# Patient Record
Sex: Male | Born: 1950 | Race: White | Hispanic: No | Smoking: Current every day smoker
Health system: Southern US, Community
[De-identification: ages and names within clinical notes are randomized; demographics above are authoritative.]

## PROBLEM LIST (undated history)

## (undated) DIAGNOSIS — T7840XA Allergy, unspecified, initial encounter: Secondary | ICD-10-CM

## (undated) DIAGNOSIS — E785 Hyperlipidemia, unspecified: Secondary | ICD-10-CM

## (undated) DIAGNOSIS — I493 Ventricular premature depolarization: Secondary | ICD-10-CM

## (undated) DIAGNOSIS — I10 Essential (primary) hypertension: Secondary | ICD-10-CM

## (undated) DIAGNOSIS — B1920 Unspecified viral hepatitis C without hepatic coma: Secondary | ICD-10-CM

## (undated) DIAGNOSIS — K7460 Unspecified cirrhosis of liver: Secondary | ICD-10-CM

## (undated) DIAGNOSIS — I4729 Other ventricular tachycardia: Secondary | ICD-10-CM

## (undated) HISTORY — DX: Unspecified cirrhosis of liver: K74.60

## (undated) HISTORY — DX: Hyperlipidemia, unspecified: E78.5

## (undated) HISTORY — DX: Other ventricular tachycardia: I47.29

## (undated) HISTORY — DX: Allergy, unspecified, initial encounter: T78.40XA

## (undated) HISTORY — PX: POLYPECTOMY: SHX149

## (undated) HISTORY — DX: Ventricular premature depolarization: I49.3

## (undated) HISTORY — PX: GANGLION CYST EXCISION: SHX1691

## (undated) HISTORY — DX: Essential (primary) hypertension: I10

## (undated) HISTORY — PX: COLONOSCOPY: SHX174

## (undated) HISTORY — DX: Unspecified viral hepatitis C without hepatic coma: B19.20

---

## 2013-10-23 ENCOUNTER — Other Ambulatory Visit: Payer: Self-pay | Admitting: Nurse Practitioner

## 2013-10-23 DIAGNOSIS — C220 Liver cell carcinoma: Secondary | ICD-10-CM

## 2013-11-02 ENCOUNTER — Ambulatory Visit
Admission: RE | Admit: 2013-11-02 | Discharge: 2013-11-02 | Disposition: A | Discharge status: Home / Self Care | Payer: PRIVATE HEALTH INSURANCE | Source: Ambulatory Visit | Attending: Nurse Practitioner | Admitting: Nurse Practitioner

## 2013-11-02 DIAGNOSIS — C220 Liver cell carcinoma: Secondary | ICD-10-CM

## 2013-12-02 ENCOUNTER — Encounter: Payer: Self-pay | Admitting: Internal Medicine

## 2014-01-19 ENCOUNTER — Encounter: Payer: Self-pay | Admitting: Internal Medicine

## 2014-01-19 ENCOUNTER — Ambulatory Visit (INDEPENDENT_AMBULATORY_CARE_PROVIDER_SITE_OTHER): Payer: PRIVATE HEALTH INSURANCE | Admitting: Internal Medicine

## 2014-01-19 VITALS — BP 114/70 | HR 80 | Ht 66.5 in | Wt 234.2 lb

## 2014-01-19 DIAGNOSIS — K7460 Unspecified cirrhosis of liver: Secondary | ICD-10-CM

## 2014-01-19 DIAGNOSIS — Z1211 Encounter for screening for malignant neoplasm of colon: Secondary | ICD-10-CM

## 2014-01-19 MED ORDER — MOVIPREP 100 G PO SOLR: 1.0000 | Freq: Once | ORAL | Status: DC

## 2014-01-19 NOTE — Progress Notes (Signed)
HISTORY OF PRESENT ILLNESS:  Hector Matthews is a 63 y.o. male with hypertension and hepatitis C (genotype 3), who is referred today by the [facility]atology (Dr. Eddie Dibbles Schmeltzer - [facility]) for screening upper endoscopy to rule out esophageal varices and routine screening colonoscopy. The patient was evaluated by the nurse practitioner of the hepatology group 11/30/2013 (reviewed). Laboratories from the months previous revealed ANA negative, HIV negative, ferritin 540, AFP 11.2, AST 40, ALT 57, alkaline phosphatase 53, total bilirubin 0.6, albumin 3.5. Abdominal ultrasound revealed increased echogenicity consistent with steatosis. No other abnormalities. FibroSure F4 (consistent with early cirrhosis). The patient began ribavirin 400 mg twice a day and Sofosbuvir 400 mg daily on 01/10/2014. Tolerating it well. 24 weeks of therapy anticipated. The patient works as a Administrator. GI review of systems is entirely negative. No prior GI evaluations. No family history of colon cancer or liver cancer  REVIEW OF SYSTEMS:  All non-GI ROS negative entirely upon review  Past Medical History  Diagnosis Date  . Hepatitis C   . Hypertension   . Cirrhosis     Past Surgical History  Procedure Laterality Date  . Ganglion cyst excision Right     foot    Social History Hector Matthews  reports that he has been smoking Cigarettes.  He has been smoking about 1.00 pack per day. He has never used smokeless tobacco. He reports that he does not drink alcohol or use illicit drugs.  family history includes Hypertension in his brother; Pancreatic cancer in his father.  No Known Allergies     PHYSICAL EXAMINATION: Vital signs: BP 114/70  Pulse 80  Ht 5' 6.5" (1.689 m)  Wt 234 lb 4 oz (106.255 kg)  BMI 37.25 kg/m2  Constitutional: generally well-appearing, no acute distress Psychiatric: alert and oriented x3, cooperative Eyes: extraocular movements intact, anicteric, conjunctiva pink Mouth: oral  pharynx moist, no lesions Neck: supple no lymphadenopathy Cardiovascular: heart regular rate and rhythm, no murmur Lungs: clear to auscultation bilaterally Abdomen: soft, nontender, nondistended, no obvious ascites, no peritoneal signs, normal bowel sounds, no organomegaly Rectal: Deferred until colonoscopy Extremities: no lower extremity edema bilaterally Skin: no lesions on visible extremities Neuro: No focal deficits. No asterixis.    ASSESSMENT:  #1. Hepatitis C, genotype 3. Recent initiation of medical therapy as discussed above #2. Colon cancer screening. Baseline risk   PLAN:  #1. Upper endoscopy to screen for varices.The nature of the procedure, as well as the risks, benefits, and alternatives were carefully and thoroughly reviewed with the patient. Ample time for discussion and questions allowed. The patient understood, was satisfied, and agreed to proceed. #2. Screening colonoscopy. Movi prep prescribed. Patient instructed on its use. The nature of the procedure, as well as the risks, benefits, and alternatives were carefully and thoroughly reviewed with the patient. Ample time for discussion and questions allowed. The patient understood, was satisfied, and agreed to proceed. #3. Ongoing management of liver disease per hepatology specialties clinic

## 2014-01-19 NOTE — Patient Instructions (Addendum)
You have been given a separate informational sheet regarding your tobacco use, the importance of quitting and local resources to help you quit.   You have been scheduled for an endoscopy and colonoscopy with propofol. Please follow the written instructions given to you at your visit today. Please pick up your prep at the pharmacy within the next 1-3 days. If you use inhalers (even only as needed), please bring them with you on the day of your procedure. Your physician has requested that you go to www.startemmi.com and enter the access code given to you at your visit today. This web site gives a general overview about your procedure. However, you should still follow specific instructions given to you by our office regarding your preparation for the procedure.

## 2014-02-16 ENCOUNTER — Other Ambulatory Visit: Payer: Self-pay | Admitting: Nurse Practitioner

## 2014-02-16 DIAGNOSIS — C220 Liver cell carcinoma: Secondary | ICD-10-CM

## 2014-03-01 ENCOUNTER — Ambulatory Visit
Admission: RE | Admit: 2014-03-01 | Discharge: 2014-03-01 | Disposition: A | Discharge status: Home / Self Care | Payer: PRIVATE HEALTH INSURANCE | Source: Ambulatory Visit | Attending: Nurse Practitioner | Admitting: Nurse Practitioner

## 2014-03-01 DIAGNOSIS — C220 Liver cell carcinoma: Secondary | ICD-10-CM

## 2014-03-08 ENCOUNTER — Encounter: Payer: Self-pay | Admitting: Internal Medicine

## 2014-03-08 ENCOUNTER — Ambulatory Visit (AMBULATORY_SURGERY_CENTER): Payer: PRIVATE HEALTH INSURANCE | Admitting: Internal Medicine

## 2014-03-08 ENCOUNTER — Other Ambulatory Visit: Payer: Self-pay | Admitting: Nurse Practitioner

## 2014-03-08 VITALS — BP 115/70 | HR 56 | Temp 97.9°F | Resp 19 | Ht 66.5 in | Wt 234.0 lb

## 2014-03-08 DIAGNOSIS — K5730 Diverticulosis of large intestine without perforation or abscess without bleeding: Secondary | ICD-10-CM

## 2014-03-08 DIAGNOSIS — K2100 Gastro-esophageal reflux disease with esophagitis, without bleeding: Secondary | ICD-10-CM

## 2014-03-08 DIAGNOSIS — Z1211 Encounter for screening for malignant neoplasm of colon: Secondary | ICD-10-CM

## 2014-03-08 DIAGNOSIS — K7460 Unspecified cirrhosis of liver: Secondary | ICD-10-CM

## 2014-03-08 DIAGNOSIS — D126 Benign neoplasm of colon, unspecified: Secondary | ICD-10-CM

## 2014-03-08 DIAGNOSIS — K769 Liver disease, unspecified: Secondary | ICD-10-CM

## 2014-03-08 DIAGNOSIS — K2900 Acute gastritis without bleeding: Secondary | ICD-10-CM

## 2014-03-08 MED ORDER — SODIUM CHLORIDE 0.9 % IV SOLN: 500.0000 mL | INTRAVENOUS | Status: DC

## 2014-03-08 NOTE — Progress Notes (Signed)
Called to room to assist during endoscopic procedure.  Patient ID and intended procedure confirmed with present staff. Received instructions for my participation in the procedure from the performing physician.  

## 2014-03-08 NOTE — Op Note (Signed)
[place]  [address] [place], [id number]   ENDOSCOPY PROCEDURE REPORT  PATIENT: Hector, Matthews  MR#: [id number] BIRTHDATE: [birth year] , 63  yrs. old GENDER: Male ENDOSCOPIST: Eustace Quail, MD REFERRED BY:  .  Self / Office PROCEDURE DATE:  03/08/2014 PROCEDURE:  EGD w/ biopsy ASA CLASS:     Class II INDICATIONS:  Screening for varices. MEDICATIONS: MAC sedation, administered by CRNA and propofol (Diprivan) 70mg  IV TOPICAL ANESTHETIC: none  DESCRIPTION OF PROCEDURE: After the risks benefits and alternatives of the procedure were thoroughly explained, informed consent was obtained.  The LB [id number] endoscope was introduced through the mouth and advanced to the second portion of the duodenum. Without limitations.  The instrument was slowly withdrawn as the mucosa was fully examined.    EXAM: The esophagus revealed mild distal esophagitis with edema and friability of the mucosa.  No varices.  Stomach revealed several small antral erosions, but was otherwise normal.  No gastric varices.  CLO biopsy taken.  The duodenum was normal.  Retroflexed views revealed no abnormalities.     The scope was then withdrawn from the patient and the procedure completed.  COMPLICATIONS: There were no complications. ENDOSCOPIC IMPRESSION: 1. GERD with endoscopic evidence of esophagitis 2. Mild gastritis status post CLO biopsy 3. No esophageal varices or portal hypertensive gastropathy  RECOMMENDATIONS: 1.  Prilosec OTC 20 mg daily 2.  Rx CLO if positive 3.  Repeat upper Endoscopy to screen for varices in 2 years 4. Return to the care of your primary provider and hepatologist  REPEAT EXAM:  eSigned:  Eustace Quail, MD 03/08/2014 2:45 PM   CC:The Patient  ; Suella Grove, PA-C; Theodoro Parma, MD [facility])

## 2014-03-08 NOTE — Op Note (Signed)
[place]  [address] [place], [id number]   COLONOSCOPY PROCEDURE REPORT  PATIENT: Hector Matthews, Hector Matthews  MR#: [id number] BIRTHDATE: 15-Feb-1951 , 63  yrs. old GENDER: Male ENDOSCOPIST: Eustace Quail, MD REFERRED BY:.  Self / Office (Dr Eddie Dibbles Schmeltzer - [facility]) PROCEDURE DATE:  03/08/2014 PROCEDURE:   Colonoscopy with snare polypectomy x 8 First Screening Colonoscopy - Avg.  risk and is 50 yrs.  old or older Yes.  Prior Negative Screening - Now for repeat screening. N/A  History of Adenoma - Now for follow-up colonoscopy & has been > or = to 3 yrs.  N/A  Polyps Removed Today? Yes. ASA CLASS:   Class II INDICATIONS:average risk screening. MEDICATIONS: MAC sedation, administered by CRNA and propofol (Diprivan) 320mg  IV DESCRIPTION OF PROCEDURE:   After the risks benefits and alternatives of the procedure were thoroughly explained, informed consent was obtained.  A digital rectal exam revealed no abnormalities of the rectum.   The LB [id number]  endoscope was introduced through the anus and advanced to the cecum, which was identified by both the appendix and ileocecal valve. No adverse events experienced.   The quality of the prep was excellent, using MoviPrep  The instrument was then slowly withdrawn as the colon was fully examined.  COLON FINDINGS: Eight polyps ranging between 3-70mm in size were found at the cecum, ascending, and transverse colon.  A polypectomy was performed with a cold snare.  The resection was complete and the polyp tissue was completely retrieved.   Severe diverticulosis was noted in the left colon.   The colon mucosa was otherwise normal.  Retroflexed views revealed internal hemorrhoids. The time to cecum=2 min 36 sec.  Withdrawal time=14 minutes 15 seconds.  The scope was withdrawn and the procedure completed. COMPLICATIONS: There were no complications. ENDOSCOPIC IMPRESSION: 1.   Eight polyps were found in the  colon; polypectomy  was performed with a cold snare 2.   Severe diverticulosis was noted in the left colon 3.   The colon mucosa was otherwise normal  RECOMMENDATIONS: 1. Repeat Colonoscopy in 3 years.eSigned:  Eustace Quail, MD 03/08/2014 2:40 PM   cc: The Patient   ; Suella Grove, PA-C; Theodoro Parma, MD [facility])   PATIENT NAME:  [name], [name] MR#: [id number]

## 2014-03-08 NOTE — Patient Instructions (Addendum)
YOU HAD AN ENDOSCOPIC PROCEDURE TODAY AT THE [facility]: Refer to the procedure report that was given to you for any specific questions about what was found during the examination.  If the procedure report does not answer your questions, please call your gastroenterologist to clarify.  If you requested that your care partner not be given the details of your procedure findings, then the procedure report has been included in a sealed envelope for you to review at your convenience later.  YOU SHOULD EXPECT: Some feelings of bloating in the abdomen. Passage of more gas than usual.  Walking can help get rid of the air that was put into your GI tract during the procedure and reduce the bloating. If you had a lower endoscopy (such as a colonoscopy or flexible sigmoidoscopy) you may notice spotting of blood in your stool or on the toilet paper. If you underwent a bowel prep for your procedure, then you may not have a normal bowel movement for a few days.  DIET: Your first meal following the procedure should be a light meal and then it is ok to progress to your normal diet.  A half-sandwich or bowl of soup is an example of a good first meal.  Heavy or fried foods are harder to digest and may make you feel nauseous or bloated.  Likewise meals heavy in dairy and vegetables can cause extra gas to form and this can also increase the bloating.  Drink plenty of fluids but you should avoid alcoholic beverages for 24 hours.  ACTIVITY: Your care partner should take you home directly after the procedure.  You should plan to take it easy, moving slowly for the rest of the day.  You can resume normal activity the day after the procedure however you should NOT DRIVE or use heavy machinery for 24 hours (because of the sedation medicines used during the test).    SYMPTOMS TO REPORT IMMEDIATELY: A gastroenterologist can be reached at any hour.  During normal business hours, 8:30 AM to 5:00 PM Monday through Friday,  call [phone].  After hours and on weekends, please call the GI answering service at [phone] who will take a message and have the physician on call contact you.   Following lower endoscopy (colonoscopy or flexible sigmoidoscopy):  Excessive amounts of blood in the stool  Significant tenderness or worsening of abdominal pains  Swelling of the abdomen that is new, acute  Fever of 100F or higher  Following upper endoscopy (EGD)  Vomiting of blood or coffee ground material  New chest pain or pain under the shoulder blades  Painful or persistently difficult swallowing  New shortness of breath  Fever of 100F or higher  Black, tarry-looking stools  FOLLOW UP: If any biopsies were taken you will be contacted by phone or by letter within the next 1-3 weeks.  Call your gastroenterologist if you have not heard about the biopsies in 3 weeks.  Our staff will call the home number listed on your records the next business day following your procedure to check on you and address any questions or concerns that you may have at that time regarding the information given to you following your procedure. This is a courtesy call and so if there is no answer at the home number and we have not heard from you through the emergency physician on call, we will assume that you have returned to your regular daily activities without incident.  SIGNATURES/CONFIDENTIALITY: You and/or your care   partner have signed paperwork which will be entered into your electronic medical record.  These signatures attest to the fact that that the information above on your After Visit Summary has been reviewed and is understood.  Full responsibility of the confidentiality of this discharge information lies with you and/or your care-partner.  Polyps, diverticulosis information given.  Repeat colonoscopy in 3 years.  GERD information given. Next colonoscopy to screen for varices in 2 years. Gastritis information  given  Prilosec OTC 20 mg. Daily.

## 2014-03-08 NOTE — Progress Notes (Signed)
A/ox3, pleased with MAC, report to RN 

## 2014-03-09 ENCOUNTER — Other Ambulatory Visit: Payer: Self-pay | Admitting: Nurse Practitioner

## 2014-03-09 ENCOUNTER — Telehealth: Payer: Self-pay | Admitting: *Deleted

## 2014-03-09 DIAGNOSIS — K769 Liver disease, unspecified: Secondary | ICD-10-CM

## 2014-03-09 NOTE — Telephone Encounter (Signed)
  Follow up Call-  Call back number 03/08/2014  Post procedure Call Back phone  # [phone]  Permission to leave phone message Yes     Patient questions:  Do you have a fever, pain , or abdominal swelling? No. Pain Score  0 *  Have you tolerated food without any problems? Yes.    Have you been able to return to your normal activities? Yes.    Do you have any questions about your discharge instructions: Diet   No. Medications  No. Follow up visit  No.  Do you have questions or concerns about your Care? No.  Actions: * If pain score is 4 or above: No action needed, pain <4.

## 2014-03-11 LAB — HELICOBACTER PYLORI SCREEN-BIOPSY: UREASE: NEGATIVE

## 2014-03-22 ENCOUNTER — Ambulatory Visit
Admission: RE | Admit: 2014-03-22 | Discharge: 2014-03-22 | Disposition: A | Discharge status: Home / Self Care | Payer: PRIVATE HEALTH INSURANCE | Source: Ambulatory Visit | Attending: Nurse Practitioner | Admitting: Nurse Practitioner

## 2014-03-22 ENCOUNTER — Other Ambulatory Visit: Payer: PRIVATE HEALTH INSURANCE

## 2014-03-22 DIAGNOSIS — K769 Liver disease, unspecified: Secondary | ICD-10-CM

## 2014-03-23 ENCOUNTER — Encounter: Payer: Self-pay | Admitting: Internal Medicine

## 2014-04-06 ENCOUNTER — Other Ambulatory Visit: Payer: PRIVATE HEALTH INSURANCE

## 2014-12-20 ENCOUNTER — Other Ambulatory Visit: Payer: Self-pay | Admitting: Nurse Practitioner

## 2014-12-20 DIAGNOSIS — K769 Liver disease, unspecified: Secondary | ICD-10-CM

## 2014-12-27 ENCOUNTER — Ambulatory Visit
Admission: RE | Admit: 2014-12-27 | Discharge: 2014-12-27 | Disposition: A | Discharge status: Home / Self Care | Payer: PRIVATE HEALTH INSURANCE | Source: Ambulatory Visit | Attending: Nurse Practitioner | Admitting: Nurse Practitioner

## 2014-12-27 DIAGNOSIS — K769 Liver disease, unspecified: Secondary | ICD-10-CM

## 2014-12-27 MED ORDER — IOHEXOL 350 MG/ML SOLN
100.0000 mL | Freq: Once | INTRAVENOUS | Status: AC | PRN
  Administered 2014-12-27: 100 mL via INTRAVENOUS

## 2015-06-27 ENCOUNTER — Other Ambulatory Visit: Payer: Self-pay | Admitting: Nurse Practitioner

## 2015-06-27 DIAGNOSIS — C220 Liver cell carcinoma: Secondary | ICD-10-CM

## 2015-07-04 ENCOUNTER — Ambulatory Visit
Admission: RE | Admit: 2015-07-04 | Discharge: 2015-07-04 | Disposition: A | Discharge status: Home / Self Care | Payer: PRIVATE HEALTH INSURANCE | Source: Ambulatory Visit | Attending: Nurse Practitioner | Admitting: Nurse Practitioner

## 2015-07-04 DIAGNOSIS — C220 Liver cell carcinoma: Secondary | ICD-10-CM

## 2015-12-26 ENCOUNTER — Other Ambulatory Visit: Payer: Self-pay | Admitting: Nurse Practitioner

## 2015-12-26 DIAGNOSIS — K7469 Other cirrhosis of liver: Secondary | ICD-10-CM

## 2016-01-09 ENCOUNTER — Other Ambulatory Visit: Payer: PRIVATE HEALTH INSURANCE

## 2016-01-23 ENCOUNTER — Ambulatory Visit
Admission: RE | Admit: 2016-01-23 | Discharge: 2016-01-23 | Disposition: A | Discharge status: Home / Self Care | Payer: PRIVATE HEALTH INSURANCE | Source: Ambulatory Visit | Attending: Nurse Practitioner | Admitting: Nurse Practitioner

## 2016-01-23 DIAGNOSIS — K7469 Other cirrhosis of liver: Secondary | ICD-10-CM

## 2016-06-26 ENCOUNTER — Other Ambulatory Visit: Payer: Self-pay | Admitting: Nurse Practitioner

## 2016-06-26 DIAGNOSIS — K7400 Hepatic fibrosis, unspecified: Secondary | ICD-10-CM

## 2016-07-02 ENCOUNTER — Ambulatory Visit
Admission: RE | Admit: 2016-07-02 | Discharge: 2016-07-02 | Disposition: A | Discharge status: Home / Self Care | Payer: PRIVATE HEALTH INSURANCE | Source: Ambulatory Visit | Attending: Nurse Practitioner | Admitting: Nurse Practitioner

## 2016-07-02 DIAGNOSIS — K7400 Hepatic fibrosis, unspecified: Secondary | ICD-10-CM

## 2016-12-11 ENCOUNTER — Other Ambulatory Visit: Payer: Self-pay | Admitting: Nurse Practitioner

## 2016-12-11 DIAGNOSIS — K7469 Other cirrhosis of liver: Secondary | ICD-10-CM

## 2016-12-24 ENCOUNTER — Ambulatory Visit
Admission: RE | Admit: 2016-12-24 | Discharge: 2016-12-24 | Disposition: A | Discharge status: Home / Self Care | Payer: PRIVATE HEALTH INSURANCE | Source: Ambulatory Visit | Attending: Nurse Practitioner | Admitting: Nurse Practitioner

## 2016-12-24 DIAGNOSIS — K7469 Other cirrhosis of liver: Secondary | ICD-10-CM

## 2017-01-09 ENCOUNTER — Encounter: Payer: Self-pay | Admitting: Internal Medicine

## 2017-06-25 ENCOUNTER — Other Ambulatory Visit: Payer: Self-pay | Admitting: Nurse Practitioner

## 2017-06-25 DIAGNOSIS — K824 Cholesterolosis of gallbladder: Secondary | ICD-10-CM

## 2017-06-25 DIAGNOSIS — K7469 Other cirrhosis of liver: Secondary | ICD-10-CM

## 2017-06-25 DIAGNOSIS — K869 Disease of pancreas, unspecified: Secondary | ICD-10-CM

## 2017-07-03 ENCOUNTER — Ambulatory Visit
Admission: RE | Admit: 2017-07-03 | Discharge: 2017-07-03 | Disposition: A | Discharge status: Home / Self Care | Payer: PRIVATE HEALTH INSURANCE | Source: Ambulatory Visit | Attending: Nurse Practitioner | Admitting: Nurse Practitioner

## 2017-07-03 DIAGNOSIS — K7469 Other cirrhosis of liver: Secondary | ICD-10-CM

## 2017-07-03 DIAGNOSIS — K824 Cholesterolosis of gallbladder: Secondary | ICD-10-CM

## 2017-07-03 DIAGNOSIS — K869 Disease of pancreas, unspecified: Secondary | ICD-10-CM

## 2017-07-03 MED ORDER — IOPAMIDOL (ISOVUE-300) INJECTION 61%
125.0000 mL | Freq: Once | INTRAVENOUS | Status: AC | PRN
  Administered 2017-07-03: 125 mL via INTRAVENOUS

## 2017-12-25 ENCOUNTER — Other Ambulatory Visit: Payer: Self-pay | Admitting: Nurse Practitioner

## 2017-12-25 DIAGNOSIS — K7469 Other cirrhosis of liver: Secondary | ICD-10-CM

## 2018-02-17 ENCOUNTER — Ambulatory Visit
Admission: RE | Admit: 2018-02-17 | Discharge: 2018-02-17 | Disposition: A | Discharge status: Home / Self Care | Payer: Medicare Other | Source: Ambulatory Visit | Attending: Nurse Practitioner | Admitting: Nurse Practitioner

## 2018-02-17 DIAGNOSIS — K7460 Unspecified cirrhosis of liver: Secondary | ICD-10-CM | POA: Diagnosis not present

## 2018-02-17 DIAGNOSIS — K7469 Other cirrhosis of liver: Secondary | ICD-10-CM

## 2018-02-19 DIAGNOSIS — I10 Essential (primary) hypertension: Secondary | ICD-10-CM | POA: Diagnosis not present

## 2018-02-19 DIAGNOSIS — Z720 Tobacco use: Secondary | ICD-10-CM | POA: Diagnosis not present

## 2018-02-19 DIAGNOSIS — E782 Mixed hyperlipidemia: Secondary | ICD-10-CM | POA: Diagnosis not present

## 2018-06-24 DIAGNOSIS — K7469 Other cirrhosis of liver: Secondary | ICD-10-CM | POA: Diagnosis not present

## 2018-06-25 ENCOUNTER — Other Ambulatory Visit: Payer: Self-pay | Admitting: Nurse Practitioner

## 2018-06-25 DIAGNOSIS — K7469 Other cirrhosis of liver: Secondary | ICD-10-CM

## 2018-08-27 DIAGNOSIS — Z125 Encounter for screening for malignant neoplasm of prostate: Secondary | ICD-10-CM | POA: Diagnosis not present

## 2018-08-27 DIAGNOSIS — I10 Essential (primary) hypertension: Secondary | ICD-10-CM | POA: Diagnosis not present

## 2018-08-27 DIAGNOSIS — I440 Atrioventricular block, first degree: Secondary | ICD-10-CM | POA: Diagnosis not present

## 2018-08-27 DIAGNOSIS — Z23 Encounter for immunization: Secondary | ICD-10-CM | POA: Diagnosis not present

## 2018-08-27 DIAGNOSIS — E782 Mixed hyperlipidemia: Secondary | ICD-10-CM | POA: Diagnosis not present

## 2018-08-27 DIAGNOSIS — Z0000 Encounter for general adult medical examination without abnormal findings: Secondary | ICD-10-CM | POA: Diagnosis not present

## 2018-08-27 DIAGNOSIS — R9431 Abnormal electrocardiogram [ECG] [EKG]: Secondary | ICD-10-CM | POA: Diagnosis not present

## 2018-08-27 DIAGNOSIS — Z1211 Encounter for screening for malignant neoplasm of colon: Secondary | ICD-10-CM | POA: Diagnosis not present

## 2018-08-28 ENCOUNTER — Ambulatory Visit
Admission: RE | Admit: 2018-08-28 | Discharge: 2018-08-28 | Disposition: A | Discharge status: Home / Self Care | Payer: Medicare Other | Source: Ambulatory Visit | Attending: Nurse Practitioner | Admitting: Nurse Practitioner

## 2018-08-28 DIAGNOSIS — K7469 Other cirrhosis of liver: Secondary | ICD-10-CM

## 2018-08-28 DIAGNOSIS — K7460 Unspecified cirrhosis of liver: Secondary | ICD-10-CM | POA: Diagnosis not present

## 2018-09-24 ENCOUNTER — Encounter: Payer: Self-pay | Admitting: Internal Medicine

## 2018-10-02 ENCOUNTER — Ambulatory Visit: Payer: Self-pay | Admitting: Cardiology

## 2018-10-10 ENCOUNTER — Ambulatory Visit (AMBULATORY_SURGERY_CENTER): Payer: Self-pay | Admitting: *Deleted

## 2018-10-10 VITALS — Ht 67.0 in | Wt 214.0 lb

## 2018-10-10 DIAGNOSIS — Z860100 Personal history of colon polyps, unspecified: Secondary | ICD-10-CM

## 2018-10-10 MED ORDER — SUPREP BOWEL PREP KIT 17.5-3.13-1.6 GM/177ML PO SOLN: 1.0000 | Freq: Once | ORAL | 0 refills | Status: AC

## 2018-10-10 NOTE — Progress Notes (Signed)
No egg or soy allergy known to patient  No issues with past sedation with any surgeries  or procedures, no intubation problems  No diet pills per patient No home 02 use per patient  No blood thinners per patient  Pt denies issues with constipation  No A fib or A flutter  EMMI video  Offered and declined Sample suprep given : lot # [id number], exp 09/2020

## 2018-11-06 ENCOUNTER — Encounter: Payer: Medicare Other | Admitting: Internal Medicine

## 2019-01-28 DIAGNOSIS — K7469 Other cirrhosis of liver: Secondary | ICD-10-CM | POA: Diagnosis not present

## 2019-01-29 DIAGNOSIS — K7469 Other cirrhosis of liver: Secondary | ICD-10-CM | POA: Diagnosis not present

## 2019-02-05 ENCOUNTER — Other Ambulatory Visit: Payer: Self-pay | Admitting: Nurse Practitioner

## 2019-02-05 DIAGNOSIS — K7469 Other cirrhosis of liver: Secondary | ICD-10-CM

## 2019-02-18 ENCOUNTER — Encounter: Payer: Self-pay | Admitting: Internal Medicine

## 2019-03-05 ENCOUNTER — Other Ambulatory Visit: Payer: Self-pay

## 2019-03-05 ENCOUNTER — Ambulatory Visit: Payer: Medicare Other | Admitting: *Deleted

## 2019-03-05 VITALS — Ht 68.0 in | Wt 200.0 lb

## 2019-03-05 DIAGNOSIS — Z86010 Personal history of colonic polyps: Secondary | ICD-10-CM

## 2019-03-05 MED ORDER — SUPREP BOWEL PREP KIT 17.5-3.13-1.6 GM/177ML PO SOLN: 1.0000 | Freq: Once | ORAL | 0 refills | Status: AC

## 2019-03-05 NOTE — Progress Notes (Signed)
No egg or soy allergy known to patient  No issues with past sedation with any surgeries  or procedures, no intubation problems  No diet pills per patient No home 02 use per patient  No blood thinners per patient  Pt denies issues with constipation  No A fib or A flutter  EMMI video sent to pt's e mail   Pt has a Suprep kit at home from 10-2018 PV   Pt verified name, DOB, address and insurance during PV today. Pt mailed instruction packet to included paper to complete and mail back to [facility] with addressed and stamped envelope, Emmi video, copy of consent form to read and not return, and instructions. PV completed over the phone. Pt encouraged to call with questions or issues   Pt is aware that care partner will wait in the car during procedure; if they feel like they will be too hot to wait in the car; they may wait in the lobby.  We want them to wear a mask (we do not have any that we can provide them), practice social distancing, and we will check their temperatures when they get here.  I did remind patient that their care partner needs to stay in the parking lot the entire time. Pt will wear mask into building.

## 2019-03-11 ENCOUNTER — Other Ambulatory Visit: Payer: Medicare Other

## 2019-03-18 ENCOUNTER — Telehealth: Payer: Self-pay | Admitting: Internal Medicine

## 2019-03-18 NOTE — Telephone Encounter (Signed)

## 2019-03-19 ENCOUNTER — Encounter: Payer: Self-pay | Admitting: Internal Medicine

## 2019-03-19 ENCOUNTER — Other Ambulatory Visit: Payer: Self-pay

## 2019-03-19 ENCOUNTER — Ambulatory Visit (AMBULATORY_SURGERY_CENTER): Payer: Medicare Other | Admitting: Internal Medicine

## 2019-03-19 VITALS — BP 114/72 | HR 46 | Temp 97.7°F | Resp 12 | Ht 68.0 in | Wt 200.0 lb

## 2019-03-19 DIAGNOSIS — Z86010 Personal history of colonic polyps: Secondary | ICD-10-CM | POA: Diagnosis not present

## 2019-03-19 DIAGNOSIS — D122 Benign neoplasm of ascending colon: Secondary | ICD-10-CM | POA: Diagnosis not present

## 2019-03-19 MED ORDER — SODIUM CHLORIDE 0.9 % IV SOLN: 500.0000 mL | Freq: Once | INTRAVENOUS | Status: DC

## 2019-03-19 NOTE — Patient Instructions (Signed)
HANDOUTS given for diverticulosis, polyps and hemorrhoids.  YOU HAD AN ENDOSCOPIC PROCEDURE TODAY AT [facility]:   Refer to the procedure report that was given to you for any specific questions about what was found during the examination.  If the procedure report does not answer your questions, please call your gastroenterologist to clarify.  If you requested that your care partner not be given the details of your procedure findings, then the procedure report has been included in a sealed envelope for you to review at your convenience later.  YOU SHOULD EXPECT: Some feelings of bloating in the abdomen. Passage of more gas than usual.  Walking can help get rid of the air that was put into your GI tract during the procedure and reduce the bloating. If you had a lower endoscopy (such as a colonoscopy or flexible sigmoidoscopy) you may notice spotting of blood in your stool or on the toilet paper. If you underwent a bowel prep for your procedure, you may not have a normal bowel movement for a few days.  Please Note:  You might notice some irritation and congestion in your nose or some drainage.  This is from the oxygen used during your procedure.  There is no need for concern and it should clear up in a day or so.  SYMPTOMS TO REPORT IMMEDIATELY:   Following lower endoscopy (colonoscopy or flexible sigmoidoscopy):  Excessive amounts of blood in the stool  Significant tenderness or worsening of abdominal pains  Swelling of the abdomen that is new, acute  Fever of 100F or higher   For urgent or emergent issues, a gastroenterologist can be reached at any hour by calling [phone].   DIET:  We do recommend a small meal at first, but then you may proceed to your regular diet.  Drink plenty of fluids but you should avoid alcoholic beverages for 24 hours.  ACTIVITY:  You should plan to take it easy for the rest of today and you should NOT DRIVE or use heavy machinery until  tomorrow (because of the sedation medicines used during the test).    FOLLOW UP: Our staff will call the number listed on your records 48-72 hours following your procedure to check on you and address any questions or concerns that you may have regarding the information given to you following your procedure. If we do not reach you, we will leave a message.  We will attempt to reach you two times.  During this call, we will ask if you have developed any symptoms of COVID 19. If you develop any symptoms (ie: fever, flu-like symptoms, shortness of breath, cough etc.) before then, please call [phone].  If you test positive for Covid 19 in the 2 weeks post procedure, please call and report this information to [place].    If any biopsies were taken you will be contacted by phone or by letter within the next 1-3 weeks.  Please call us at [phone] if you have not heard about the biopsies in 3 weeks.    SIGNATURES/CONFIDENTIALITY: You and/or your care partner have signed paperwork which will be entered into your electronic medical record.  These signatures attest to the fact that that the information above on your After Visit Summary has been reviewed and is understood.  Full responsibility of the confidentiality of this discharge information lies with you and/or your care-partner.

## 2019-03-19 NOTE — Progress Notes (Signed)
Pt's states no medical or surgical changes since previsit or office visit.  Temp and VS taken by CW

## 2019-03-19 NOTE — Progress Notes (Signed)
PT taken to PACU. Monitors in place. VSS. Report given to RN. 

## 2019-03-19 NOTE — Op Note (Signed)
[place] Patient Name: Hector Matthews Procedure Date: 03/19/2019 2:02 PM MRN: [id number] Endoscopist: Docia Chuck. Henrene Pastor , MD Age: 68 Referring MD:  Date of Birth: [birth year] Gender: Male Account #: [id number] Procedure:                Colonoscopy with cold snare polypectomy x 2 Indications:              High risk colon cancer surveillance: Personal                            history of multiple (3 or more) adenomas. Index                            examination 2015 with 8 adenomas. Overdue for                            follow-up Medicines:                Monitored Anesthesia Care Procedure:                Pre-Anesthesia Assessment:                           - Prior to the procedure, a History and Physical                            was performed, and patient medications and                            allergies were reviewed. The patient's tolerance of                            previous anesthesia was also reviewed. The risks                            and benefits of the procedure and the sedation                            options and risks were discussed with the patient.                            All questions were answered, and informed consent                            was obtained. Prior Anticoagulants: The patient has                            taken no previous anticoagulant or antiplatelet                            agents. ASA Grade Assessment: II - A patient with                            mild systemic disease. After reviewing the risks  and benefits, the patient was deemed in                            satisfactory condition to undergo the procedure.                           After obtaining informed consent, the colonoscope                            was passed under direct vision. Throughout the                            procedure, the patient's blood pressure, pulse, and                            oxygen saturations were monitored  continuously. The                            Colonoscope was introduced through the anus and                            advanced to the the cecum, identified by                            appendiceal orifice and ileocecal valve. The                            ileocecal valve, appendiceal orifice, and rectum                            were photographed. The quality of the bowel                            preparation was excellent. The colonoscopy was                            performed without difficulty. The patient tolerated                            the procedure well. The bowel preparation used was                            SUPREP via split dose instruction. Scope In: 2:20:33 PM Scope Out: 2:34:19 PM Scope Withdrawal Time: 0 hours 11 minutes 11 seconds  Total Procedure Duration: 0 hours 13 minutes 46 seconds  Findings:                 Two polyps were found in the ascending colon. The                            polyps were 3 to 4 mm in size. These polyps were                            removed with a cold snare. Resection  and retrieval                            were complete.                           Multiple diverticula were found in the entire colon.                           Internal hemorrhoids were found during retroflexion.                           The exam was otherwise without abnormality on                            direct and retroflexion views. Complications:            No immediate complications. Estimated blood loss:                            None. Estimated Blood Loss:     Estimated blood loss: none. Impression:               - Two 3 to 4 mm polyps in the ascending colon,                            removed with a cold snare. Resected and retrieved.                           - Diverticulosis in the entire examined colon.                           - Internal hemorrhoids.                           - The examination was otherwise normal on direct                             and retroflexion views. Recommendation:           - Repeat colonoscopy in 5 years for surveillance.                           - Patient has a contact number available for                            emergencies. The signs and symptoms of potential                            delayed complications were discussed with the                            patient. Return to normal activities tomorrow.                            Written discharge instructions were provided to the  patient.                           - Resume previous diet.                           - Continue present medications.                           - Await pathology results.                           NOTE: Patient underwent screening upper endoscopy                            for esophageal varices at the request of his                            hepatologist in 2015. Follow-up examination in 2 to                            3 years recommended. The patient has been                            encouraged to discuss the need for follow-up                            surveillance upper endoscopy with his hepatologist. Docia Chuck. Henrene Pastor, MD 03/19/2019 2:39:36 PM This report has been signed electronically.

## 2019-03-23 ENCOUNTER — Telehealth: Payer: Self-pay

## 2019-03-23 NOTE — Telephone Encounter (Signed)
  Follow up Call-  Call back number 03/19/2019  Post procedure Call Back phone  # [phone]  Permission to leave phone message Yes  Some recent data might be hidden     Patient questions:  Do you have a fever, pain , or abdominal swelling? No. Pain Score  0 *  Have you tolerated food without any problems? Yes.    Have you been able to return to your normal activities? Yes.    Do you have any questions about your discharge instructions: Diet   No. Medications  No. Follow up visit  No.  Do you have questions or concerns about your Care? No.  Actions: * If pain score is 4 or above: No action needed, pain <4. 1. Have you developed a fever since your procedure? no  2.   Have you had an respiratory symptoms (SOB or cough) since your procedure? no  3.   Have you tested positive for COVID 19 since your procedure no  4.   Have you had any family members/close contacts diagnosed with the COVID 19 since your procedure?  no   If yes to any of these questions please route to Joylene John, RN and Alphonsa Gin, Therapist, sports.

## 2019-03-24 ENCOUNTER — Encounter: Payer: Self-pay | Admitting: Internal Medicine

## 2019-07-15 DIAGNOSIS — K7469 Other cirrhosis of liver: Secondary | ICD-10-CM | POA: Diagnosis not present

## 2019-09-03 DIAGNOSIS — Z0000 Encounter for general adult medical examination without abnormal findings: Secondary | ICD-10-CM | POA: Diagnosis not present

## 2019-09-03 DIAGNOSIS — E782 Mixed hyperlipidemia: Secondary | ICD-10-CM | POA: Diagnosis not present

## 2019-09-03 DIAGNOSIS — K7460 Unspecified cirrhosis of liver: Secondary | ICD-10-CM | POA: Diagnosis not present

## 2019-09-03 DIAGNOSIS — Z23 Encounter for immunization: Secondary | ICD-10-CM | POA: Diagnosis not present

## 2019-09-03 DIAGNOSIS — Z125 Encounter for screening for malignant neoplasm of prostate: Secondary | ICD-10-CM | POA: Diagnosis not present

## 2019-09-03 DIAGNOSIS — I10 Essential (primary) hypertension: Secondary | ICD-10-CM | POA: Diagnosis not present

## 2020-01-21 ENCOUNTER — Ambulatory Visit
Admission: RE | Admit: 2020-01-21 | Discharge: 2020-01-21 | Disposition: A | Discharge status: Home / Self Care | Payer: Medicare Other | Source: Ambulatory Visit | Attending: Nurse Practitioner | Admitting: Nurse Practitioner

## 2020-01-21 DIAGNOSIS — K7469 Other cirrhosis of liver: Secondary | ICD-10-CM

## 2020-01-21 DIAGNOSIS — K7460 Unspecified cirrhosis of liver: Secondary | ICD-10-CM | POA: Diagnosis not present

## 2020-01-21 DIAGNOSIS — K8020 Calculus of gallbladder without cholecystitis without obstruction: Secondary | ICD-10-CM | POA: Diagnosis not present

## 2020-03-06 ENCOUNTER — Ambulatory Visit
Admission: EM | Admit: 2020-03-06 | Discharge: 2020-03-06 | Disposition: A | Discharge status: Home / Self Care | Payer: Medicare Other | Attending: Emergency Medicine | Admitting: Emergency Medicine

## 2020-03-06 ENCOUNTER — Other Ambulatory Visit: Payer: Self-pay

## 2020-03-06 ENCOUNTER — Encounter: Payer: Self-pay | Admitting: Emergency Medicine

## 2020-03-06 DIAGNOSIS — M654 Radial styloid tenosynovitis [de Quervain]: Secondary | ICD-10-CM | POA: Diagnosis not present

## 2020-03-06 MED ORDER — NAPROXEN 500 MG PO TABS: 500.0000 mg | ORAL_TABLET | Freq: Two times a day (BID) | ORAL | 0 refills | Status: DC

## 2020-03-06 NOTE — Discharge Instructions (Addendum)
RICE: rest, ice, compression, elevation as needed for pain.   Cold therapy (ice packs) can be used to help swelling both after injury and after prolonged use of areas of chronic pain/aches.  Pain medication:  500 mg Naprosyn/Aleve (naproxen) every 12 hours with food:  AVOID other NSAIDs while taking this (may have Tylenol).

## 2020-03-06 NOTE — ED Triage Notes (Signed)
Pt presents to [facility] for assessment of 1 week of left wrist swelling, very mild pain.  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

## 2020-03-06 NOTE — ED Provider Notes (Signed)
[facility]    CSN: [id number] Arrival date & time: 03/06/20  1339      History   Chief Complaint Chief Complaint  Patient presents with  . Wrist Pain    HPI Hector Matthews is a 69 y.o. male with history of cirrhosis, hep C, hypertension, allergies presenting for 1 week course of left wrist pain.  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  Denies numbness, injury, deformity, discoloration.  Has tried single dose of ibuprofen without relief.   Past Medical History:  Diagnosis Date  . Allergy   . Cirrhosis ([place])   . Hepatitis C   . Hyperlipidemia   . Hypertension     There are no problems to display for this patient.   Past Surgical History:  Procedure Laterality Date  . COLONOSCOPY    . GANGLION CYST EXCISION Right    foot  . POLYPECTOMY         Home Medications    Prior to Admission medications   Medication Sig Start Date End Date Taking? Authorizing Provider  atorvastatin (LIPITOR) 10 MG tablet TK 1 T PO QD 05/28/18   [provider]  ibuprofen (ADVIL,MOTRIN) 200 MG tablet Take 200 mg by mouth every 6 (six) hours as needed.    [provider]  lisinopril-hydrochlorothiazide (PRINZIDE,ZESTORETIC) 10-12.5 MG per tablet Take 1 tablet by mouth daily.    [provider]  naproxen (NAPROSYN) 500 MG tablet Take 1 tablet (500 mg total) by mouth 2 (two) times daily. 03/06/20   Hall-Potvin, [place], PA-C  ribavirin (REBETOL) 200 MG capsule Take 200 mg by mouth 2 (two) times daily.    [provider]  Sofosbuvir 400 MG TABS Take 1 tablet by mouth.    [provider]    Family History Family History  Problem Relation Age of Onset  . Pancreatic cancer Father   . Hypertension Brother        x 2  . Colon cancer Neg Hx   . Colon polyps Neg Hx   . Esophageal cancer Neg Hx   . Rectal cancer Neg Hx   . Stomach cancer Neg Hx     Social History Social History   Tobacco Use  .  Smoking status: Current Every Day Smoker    Packs/day: 1.50    Types: Cigarettes  . Smokeless tobacco: Never Used  Vaping Use  . Vaping Use: Never used  Substance Use Topics  . Alcohol use: No  . Drug use: No     Allergies   Patient has no known allergies.   Review of Systems As per HPI   Physical Exam Triage Vital Signs ED Triage Vitals  Enc Vitals Group     BP      Pulse      Resp      Temp      Temp src      SpO2      Weight      Height      Head Circumference      Peak Flow      Pain Score      Pain Loc      Pain Edu?      Excl. in [place]?    No data found.  Updated Vital Signs BP (!) 94/61 (BP Location: Left Arm) Comment: done by APP  Pulse 67   Temp 97.6 F (36.4 C) (Oral)   Resp 16   SpO2 94%  Visual Acuity Right Eye Distance:   Left Eye Distance:   Bilateral Distance:    Right Eye Near:   Left Eye Near:    Bilateral Near:     Physical Exam Constitutional:      General: He is not in acute distress. HENT:     Head: Normocephalic and atraumatic.  Eyes:     General: No scleral icterus.    Pupils: Pupils are equal, round, and reactive to light.  Cardiovascular:     Rate and Rhythm: Normal rate.  Pulmonary:     Effort: Pulmonary effort is normal. No respiratory distress.     Breath sounds: No wheezing.  Musculoskeletal:        General: Swelling and tenderness present. Normal range of motion.     Comments: Negative Tinel's, Phalen's test.  Positive Finkelstein's.  NVI  Skin:    Coloration: Skin is not jaundiced or pale.  Neurological:     Mental Status: He is alert and oriented to person, place, and time.      UC Treatments / Results  Labs (all labs ordered are listed, but only abnormal results are displayed) Labs Reviewed - No data to display  EKG   Radiology No results found.  Procedures Procedures (including critical care time)  Medications Ordered in UC Medications - No data to display  Initial Impression /  Assessment and Plan / UC Course  I have reviewed the triage vital signs and the nursing notes.  Pertinent labs & imaging results that were available during my care of the patient were reviewed by me and considered in my medical decision making (see chart for details).     H&P consistent with de Quervain's tenosynovitis.  Applied wrist brace in office which he tolerated well.  Will treat supportively as outlined below.  Return precautions discussed, pt verbalized understanding and is agreeable to plan. Final Clinical Impressions(s) / UC Diagnoses   Final diagnoses:  De Quervain's tenosynovitis, left     Discharge Instructions     RICE: rest, ice, compression, elevation as needed for pain.   Cold therapy (ice packs) can be used to help swelling both after injury and after prolonged use of areas of chronic pain/aches.  Pain medication:  500 mg Naprosyn/Aleve (naproxen) every 12 hours with food:  AVOID other NSAIDs while taking this (may have Tylenol).    ED Prescriptions    Medication Sig Dispense Auth. Provider   naproxen (NAPROSYN) 500 MG tablet Take 1 tablet (500 mg total) by mouth 2 (two) times daily. 30 tablet Hall-Potvin, [place], PA-C     PDMP not reviewed this encounter.   Hall-Potvin, [place], [place] 03/06/20 1421

## 2020-05-04 DIAGNOSIS — M25532 Pain in left wrist: Secondary | ICD-10-CM | POA: Diagnosis not present

## 2020-05-04 DIAGNOSIS — I10 Essential (primary) hypertension: Secondary | ICD-10-CM | POA: Diagnosis not present

## 2020-05-04 DIAGNOSIS — Z23 Encounter for immunization: Secondary | ICD-10-CM | POA: Diagnosis not present

## 2020-08-10 ENCOUNTER — Other Ambulatory Visit: Payer: Self-pay | Admitting: Nurse Practitioner

## 2020-08-10 DIAGNOSIS — K7469 Other cirrhosis of liver: Secondary | ICD-10-CM

## 2020-08-12 DIAGNOSIS — K7469 Other cirrhosis of liver: Secondary | ICD-10-CM | POA: Diagnosis not present

## 2020-08-25 ENCOUNTER — Ambulatory Visit
Admission: RE | Admit: 2020-08-25 | Discharge: 2020-08-25 | Disposition: A | Discharge status: Home / Self Care | Payer: Medicare Other | Source: Ambulatory Visit | Attending: Nurse Practitioner | Admitting: Nurse Practitioner

## 2020-08-25 DIAGNOSIS — K8020 Calculus of gallbladder without cholecystitis without obstruction: Secondary | ICD-10-CM | POA: Diagnosis not present

## 2020-08-25 DIAGNOSIS — K7460 Unspecified cirrhosis of liver: Secondary | ICD-10-CM | POA: Diagnosis not present

## 2020-08-25 DIAGNOSIS — K7469 Other cirrhosis of liver: Secondary | ICD-10-CM

## 2020-10-07 IMAGING — US US ABDOMEN LIMITED
1 series · 14 of 25 positions shown · non-contrast
Comparison: 08/28/2018

CLINICAL DATA: Cirrhosis.

EXAM:
ULTRASOUND ABDOMEN LIMITED RIGHT UPPER QUADRANT

[Series 1: us abdomen limited · 0.22mm/px · 14 of 52 slices shown]
[im 1/52]
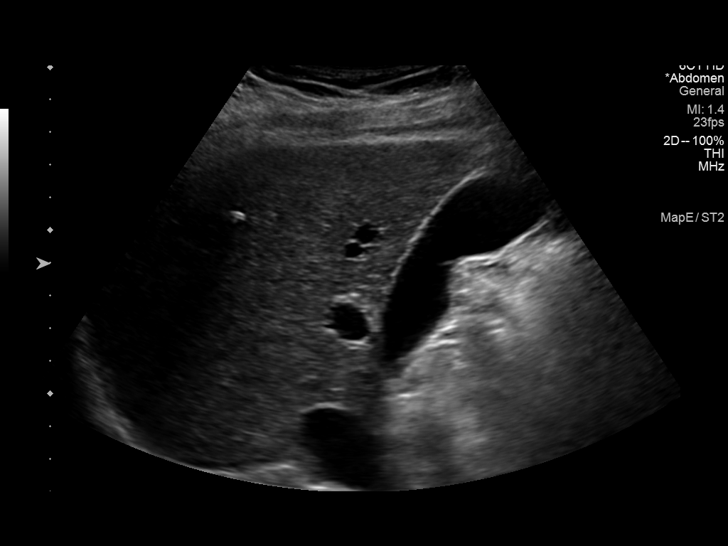
[im 5/52]
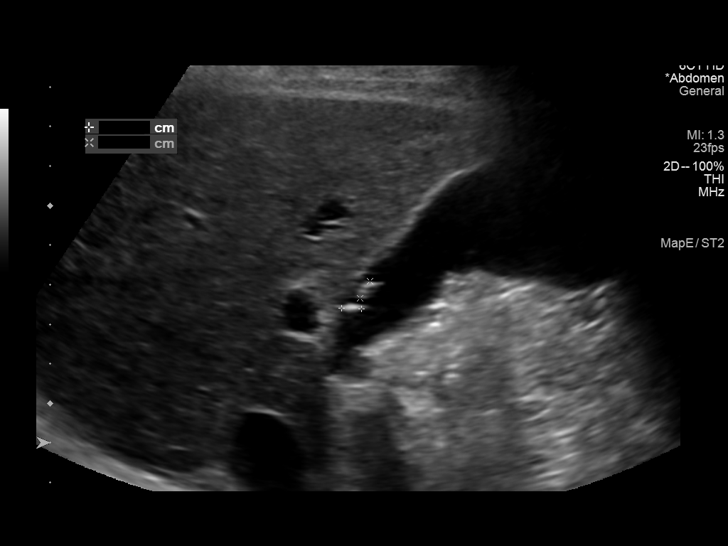
[im 9/52]
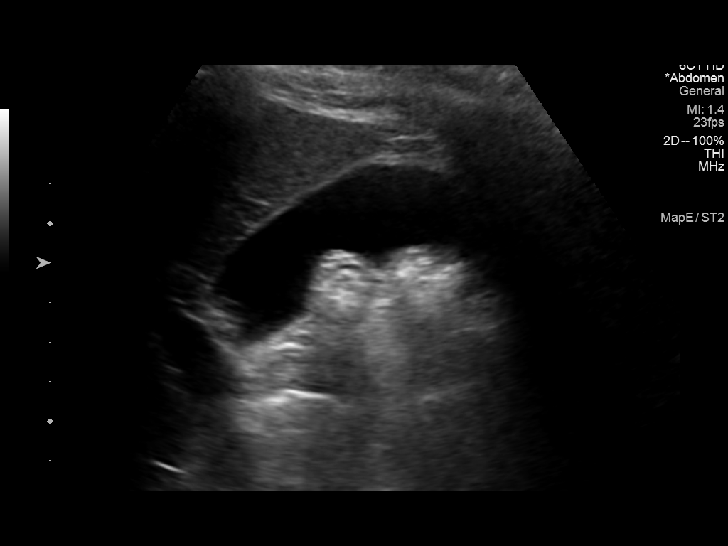
[im 13/52]
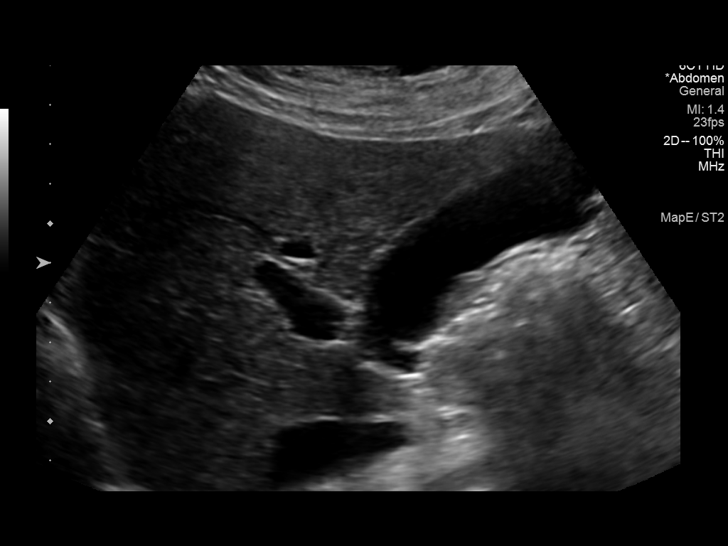
[im 18/52]
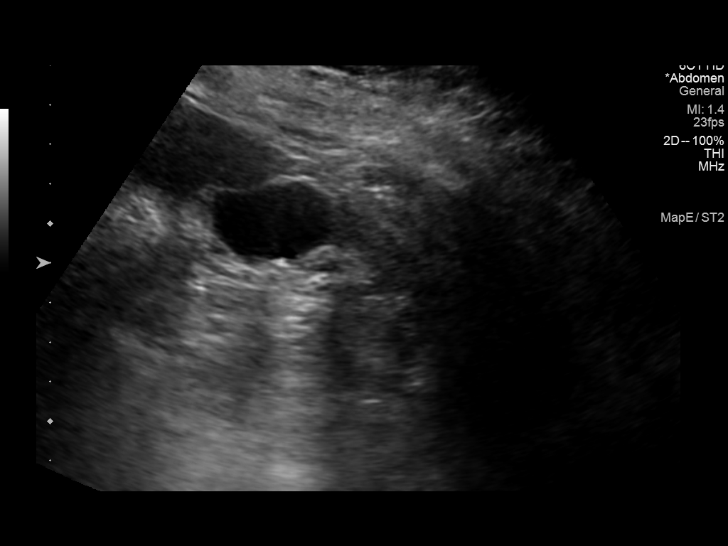
[im 20/52]
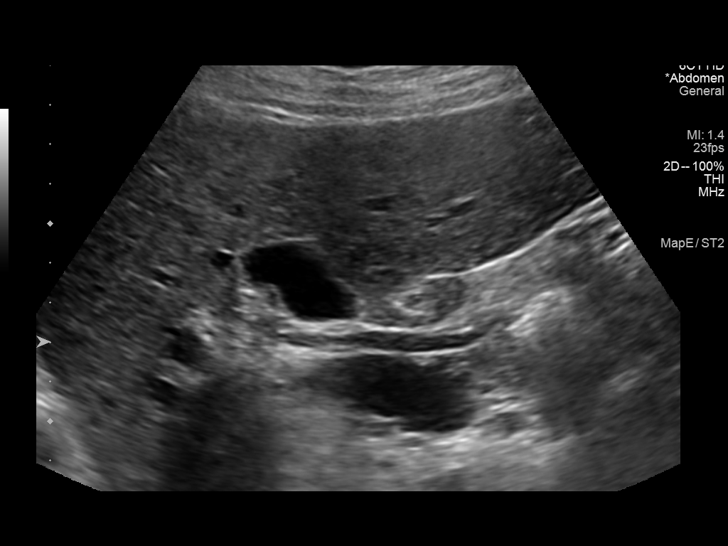
[im 24/52]
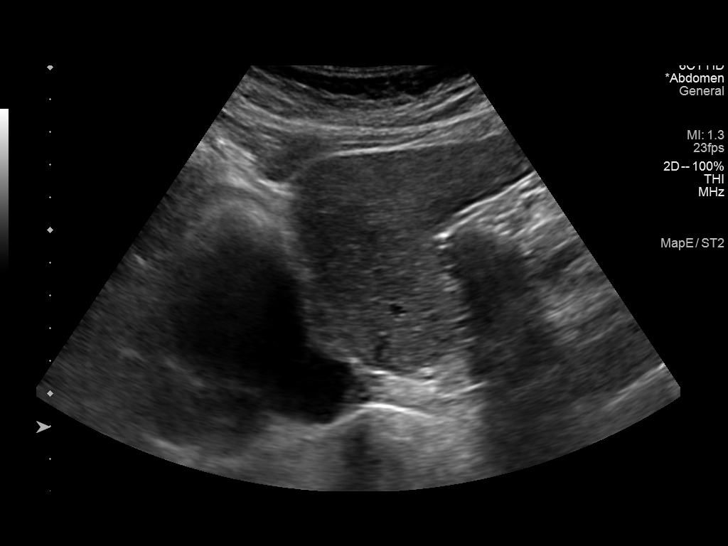
[im 28/52]
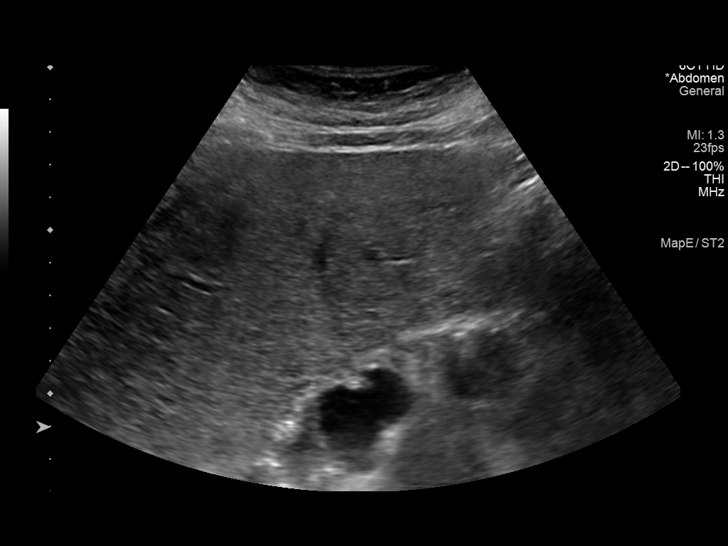
[im 32/52]
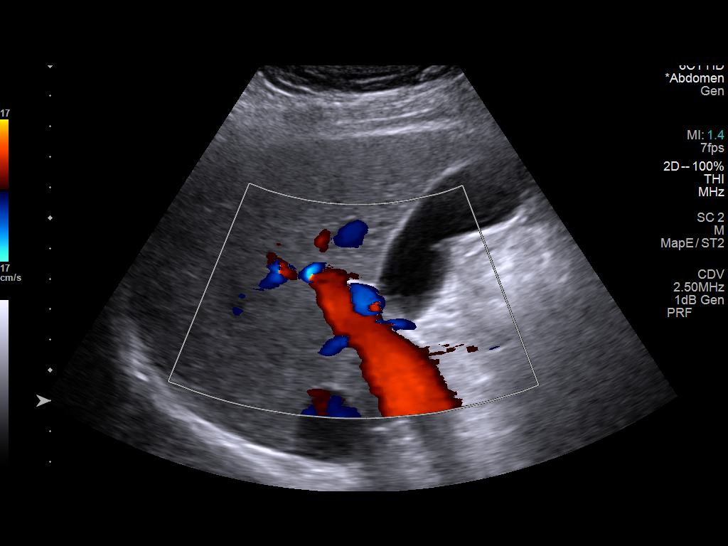
[im 35/52]
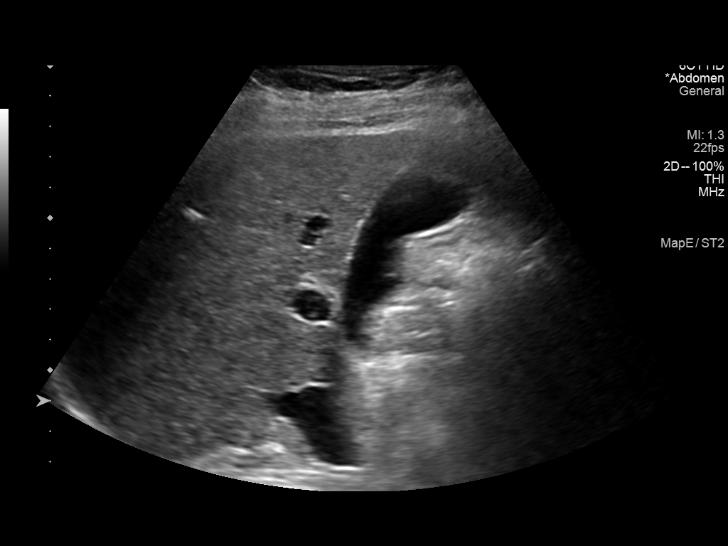
[im 39/52]
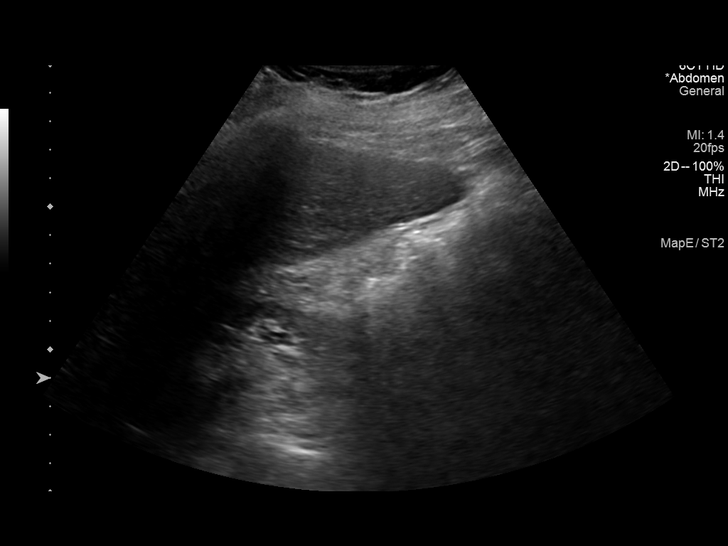
[im 43/52]
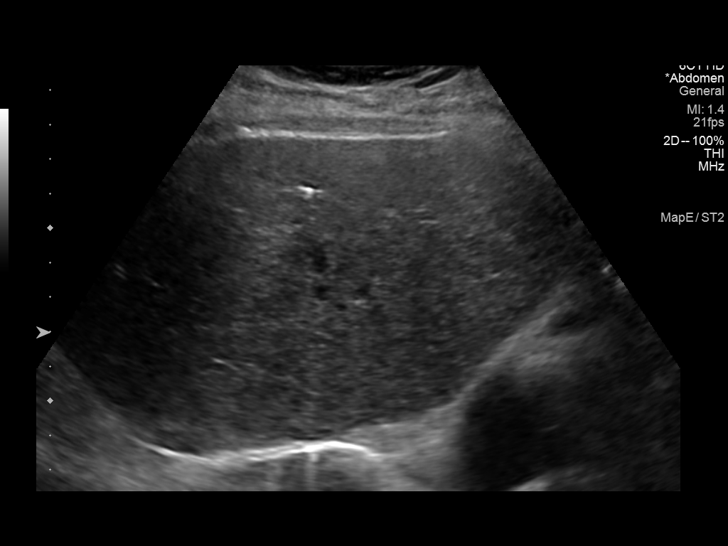
[im 47/52]
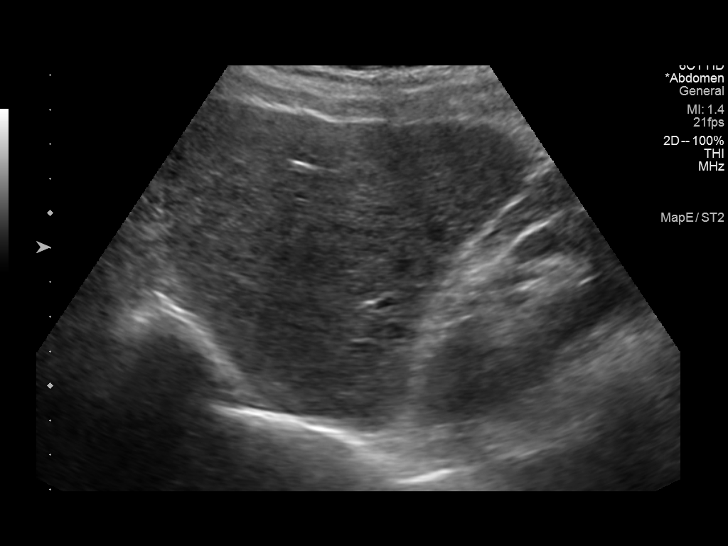
[im 52/52]
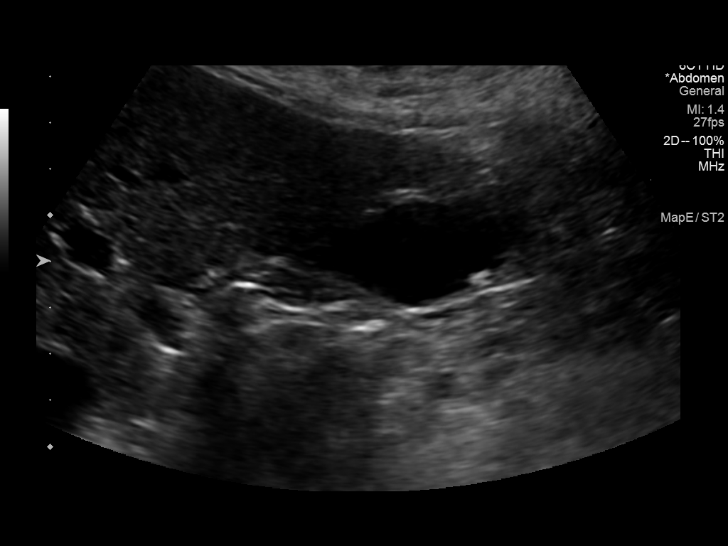

[14 of 25 positions shown; findings below may reference images not displayed]

FINDINGS: Gallbladder:

Tiny gallstones evident. No gallbladder wall thickening. No
pericholecystic fluid. The sonographer reports no sonographic
Murphy's sign.

Common bile duct:

Diameter: 4 mm

Liver:

Heterogeneous echotexture compatible with patient's known cirrhosis.
No discrete focal mass lesion evident. Portal vein is patent on
color Doppler imaging with normal direction of blood flow towards
the liver.

Other: None.
IMPRESSION: 1. Heterogeneous liver parenchyma consistent with the clinical
history of cirrhosis. No focal mass lesion evident.
2. Cholelithiasis.

## 2020-12-08 DIAGNOSIS — Z1389 Encounter for screening for other disorder: Secondary | ICD-10-CM | POA: Diagnosis not present

## 2020-12-08 DIAGNOSIS — Z0000 Encounter for general adult medical examination without abnormal findings: Secondary | ICD-10-CM | POA: Diagnosis not present

## 2020-12-12 DIAGNOSIS — I10 Essential (primary) hypertension: Secondary | ICD-10-CM | POA: Diagnosis not present

## 2020-12-12 DIAGNOSIS — E782 Mixed hyperlipidemia: Secondary | ICD-10-CM | POA: Diagnosis not present

## 2020-12-12 DIAGNOSIS — Z125 Encounter for screening for malignant neoplasm of prostate: Secondary | ICD-10-CM | POA: Diagnosis not present

## 2020-12-13 ENCOUNTER — Other Ambulatory Visit: Payer: Self-pay

## 2020-12-15 ENCOUNTER — Other Ambulatory Visit: Payer: Self-pay | Admitting: Nurse Practitioner

## 2020-12-15 DIAGNOSIS — B182 Chronic viral hepatitis C: Secondary | ICD-10-CM

## 2021-02-10 ENCOUNTER — Ambulatory Visit
Admission: RE | Admit: 2021-02-10 | Discharge: 2021-02-10 | Disposition: A | Discharge status: Home / Self Care | Payer: Medicare Other | Source: Ambulatory Visit | Attending: Nurse Practitioner | Admitting: Nurse Practitioner

## 2021-02-10 DIAGNOSIS — K8020 Calculus of gallbladder without cholecystitis without obstruction: Secondary | ICD-10-CM | POA: Diagnosis not present

## 2021-02-10 DIAGNOSIS — I10 Essential (primary) hypertension: Secondary | ICD-10-CM | POA: Insufficient documentation

## 2021-02-10 DIAGNOSIS — K7460 Unspecified cirrhosis of liver: Secondary | ICD-10-CM | POA: Insufficient documentation

## 2021-02-10 DIAGNOSIS — E785 Hyperlipidemia, unspecified: Secondary | ICD-10-CM | POA: Insufficient documentation

## 2021-02-10 DIAGNOSIS — K7469 Other cirrhosis of liver: Secondary | ICD-10-CM | POA: Diagnosis not present

## 2021-02-10 DIAGNOSIS — R001 Bradycardia, unspecified: Secondary | ICD-10-CM | POA: Diagnosis not present

## 2021-02-10 DIAGNOSIS — B182 Chronic viral hepatitis C: Secondary | ICD-10-CM

## 2021-02-15 ENCOUNTER — Encounter: Payer: Self-pay | Admitting: Cardiology

## 2021-02-15 ENCOUNTER — Other Ambulatory Visit: Payer: Self-pay

## 2021-02-15 ENCOUNTER — Ambulatory Visit: Payer: Medicare Other | Admitting: Cardiology

## 2021-02-15 VITALS — BP 138/88 | HR 60 | Temp 97.2°F | Resp 17 | Ht 68.0 in | Wt 200.0 lb

## 2021-02-15 DIAGNOSIS — E782 Mixed hyperlipidemia: Secondary | ICD-10-CM | POA: Diagnosis not present

## 2021-02-15 DIAGNOSIS — R001 Bradycardia, unspecified: Secondary | ICD-10-CM | POA: Diagnosis not present

## 2021-02-15 DIAGNOSIS — Z8619 Personal history of other infectious and parasitic diseases: Secondary | ICD-10-CM | POA: Diagnosis not present

## 2021-02-15 DIAGNOSIS — F17200 Nicotine dependence, unspecified, uncomplicated: Secondary | ICD-10-CM

## 2021-02-15 DIAGNOSIS — K7460 Unspecified cirrhosis of liver: Secondary | ICD-10-CM

## 2021-02-15 DIAGNOSIS — I10 Essential (primary) hypertension: Secondary | ICD-10-CM | POA: Diagnosis not present

## 2021-02-15 DIAGNOSIS — R072 Precordial pain: Secondary | ICD-10-CM | POA: Diagnosis not present

## 2021-02-15 DIAGNOSIS — F17210 Nicotine dependence, cigarettes, uncomplicated: Secondary | ICD-10-CM | POA: Diagnosis not present

## 2021-02-15 NOTE — Progress Notes (Signed)
Date:  02/15/2021   ID:  Hector Matthews, DOB [birth year], MRN [id number]  PCP:  Michel Harrow, PA-C  Cardiologist:  Rex Kras, DO, [facility] (established care 02/16/2020)  REASON FOR CONSULT: Bradycardia  REQUESTING PHYSICIAN:  Michel Harrow, PA-C Hector Matthews,  Hector Matthews [id number]  Chief Complaint  Patient presents with   Bradycardia   New Patient (Initial Visit)   HPI  Hector Matthews is a 70 y.o. male who presents to the office with a chief complaint of "low pulse." Patient's past medical history and cardiovascular risk factors include: Benign essential hypertension, tobacco use, hyperlipidemia, first-degree AV block, history of hepatitis C status posttreatment, cirrhosis, advanced age.  He is referred to the office at the request of Michel Harrow, PA-C for evaluation of Bradycardia.  Couple weeks ago he had gone to his hepatologist given his history of cirrhosis and was noted to have bradycardia on vital signs.  He was then referred to what he described as urgent care and had a TSH level checked which was within normal limits and then referred to cardiology for further evaluation of bradycardia.  Given his bradycardia patient denies feeling tired, fatigued, lightheaded, dizziness, near-syncope or syncope.  He has chronic shortness of breath given his history of smoking but it has not gone progressively worse.  Since he has realized that he has a low pulse he has been more cognizant of his cardiovascular symptoms.  And states that he is been experiencing chest tightness/indigestion for the last 2 weeks or so.  The discomfort is located substernally/epigastric region, 3 out of 10 in intensity, lasting for few minutes, occurs once or twice a day, usually present at rest, not brought on by effort related activities, does not resolve with relaxing.  It is usually self-limited.  Patient works as a Administrator.  No structured exercise program or daily routine.  Patient  continues to smoke at least 1.5 packs/day and has at least 50-year pack history of smoking.  FUNCTIONAL STATUS: No structured exercise program or daily routine.   ALLERGIES: No Known Allergies  MEDICATION LIST PRIOR TO VISIT: Current Meds  Medication Sig   atorvastatin (LIPITOR) 10 MG tablet TK 1 T PO QD   ibuprofen (ADVIL,MOTRIN) 200 MG tablet Take 200 mg by mouth every 6 (six) hours as needed.   lisinopril-hydrochlorothiazide (PRINZIDE,ZESTORETIC) 10-12.5 MG per tablet Take 1 tablet by mouth daily.     PAST MEDICAL HISTORY: Past Medical History:  Diagnosis Date   Allergy    Cirrhosis ([place])    Hepatitis C    Hyperlipidemia    Hypertension     PAST SURGICAL HISTORY: Past Surgical History:  Procedure Laterality Date   COLONOSCOPY     GANGLION CYST EXCISION Right    foot   POLYPECTOMY      FAMILY HISTORY: The patient family history includes Cancer in his father; Hypertension in his brother; Pancreatic cancer in his father.  SOCIAL HISTORY:  The patient  reports that he has been smoking cigarettes. He has a 33.00 pack-year smoking history. He has never used smokeless tobacco. He reports that he does not drink alcohol and does not use drugs.  REVIEW OF SYSTEMS: Review of Systems  Constitutional: Negative for chills and fever.  HENT:  Negative for hoarse voice and nosebleeds.   Eyes:  Negative for discharge, double vision and pain.  Cardiovascular:  Positive for chest pain. Negative for claudication, dyspnea on exertion, leg swelling, near-syncope, orthopnea, palpitations, paroxysmal nocturnal dyspnea and  syncope.  Respiratory:  Positive for shortness of breath (chronic). Negative for hemoptysis.   Musculoskeletal:  Negative for muscle cramps and myalgias.  Gastrointestinal:  Negative for abdominal pain, constipation, diarrhea, hematemesis, hematochezia, melena, nausea and vomiting.  Neurological:  Negative for dizziness and light-headedness.   PHYSICAL EXAM: Vitals  with BMI 02/15/2021 03/06/2020 03/06/2020  Height $Remov'5\' 8"'AuulVM$  - -  Weight 200 lbs - -  BMI 46.28 - -  Systolic 638 94 90  Diastolic 88 61 60  Pulse 60 - 67    CONSTITUTIONAL: Age-appropriate, hemodynamically stable, well-developed. No acute distress.  SKIN: Skin is warm and dry. No rash noted. No cyanosis. No pallor. No jaundice HEAD: Normocephalic and atraumatic.  EYES: No scleral icterus MOUTH/THROAT: Moist oral membranes.  NECK: No JVD present. No thyromegaly noted. No carotid bruits  LYMPHATIC: No visible cervical adenopathy.  CHEST Normal respiratory effort. No intercostal retractions  LUNGS: Decreased breath sounds bilaterally.  No stridor. No wheezes. No rales.  CARDIOVASCULAR: Bradycardia, positive S1-S2, no murmurs rubs or gallops appreciated. ABDOMINAL: Soft, nontender, nondistended, positive bowel sounds in all 4 quadrants, no apparent ascites.  EXTREMITIES: No peripheral edema, palpable DP and PT pulses bilaterally HEMATOLOGIC: No significant bruising NEUROLOGIC: Oriented to person, place, and time. Nonfocal. Normal muscle tone.  PSYCHIATRIC: Normal mood and affect. Normal behavior. Cooperative  CARDIAC DATABASE: EKG: 02/15/2021: Sinus bradycardia, 57 bpm rightward axis, low voltage, consider pulmonary disease, without underlying injury pattern.   Echocardiogram: No results found for this or any previous visit from the past 1095 days.   Stress Testing: No results found for this or any previous visit from the past 1095 days.  Heart Catheterization: None  LABORATORY DATA: No flowsheet data found.  No flowsheet data found.  Lipid Panel  No results found for: CHOL, TRIG, HDL, CHOLHDL, VLDL, LDLCALC, LDLDIRECT, LABVLDL  No components found for: NTPROBNP No results for input(s): PROBNP in the last 8760 hours. No results for input(s): TSH in the last 8760 hours.  BMP No results for input(s): NA, K, CL, CO2, GLUCOSE, BUN, CREATININE, CALCIUM, GFRNONAA, GFRAA in the last  8760 hours.  HEMOGLOBIN A1C No results found for: HGBA1C, MPG  External Labs: 12/12/2020: Hemoglobin 14.1 g/dL, hematocrit 41.1% Sodium 138, potassium 4.3, chloride 104, bicarb 29, BUN 14, creatinine 1.31 mg/dL. AST 18, ALT 11, alkaline phosphatase 52 Total cholesterol 106, triglycerides 57, HDL 35, LDL 58, non-HDL 71  02/10/2021: TSH 1.9  IMPRESSION:    ICD-10-CM   1. Bradycardia  R00.1 EKG 12-Lead    CMP14+EGFR    Magnesium    LONG TERM MONITOR (3-14 DAYS)    2. Precordial pain  R07.2 PCV ECHOCARDIOGRAM COMPLETE    PCV MYOCARDIAL PERFUSION WO LEXISCAN    3. Benign hypertension  I10     4. Mixed hyperlipidemia  E78.2     5. Hx of hepatitis C  Z86.19     6. Cirrhosis of liver without ascites  K74.60     7. Smoking  F17.200        RECOMMENDATIONS: Travers Goodley is a 70 y.o. male whose past medical history and cardiac risk factors include: Benign essential hypertension, tobacco use, hyperlipidemia, first-degree AV block, history of hepatitis C status posttreatment, cirrhosis, advanced age.  Bradycardia, asymptomatic: Patient ambulated in the office for approximately 360 feet and his heart rate ranged between 76-113 bpm. EKG does not show any underlying injury pattern. TSH within normal limits Will check CMP and magnesium level. Medications reconciled.  No identifiable reversible cause. Plan  echocardiogram and stress test to evaluate for structural heart disease and reversible ischemia respectively. Patient is asked to seek medical attention sooner if he develops symptoms of tired, fatigue, shortness of breath more than his baseline, near-syncope or syncopal events.  Precordial pain: Symptoms may be suggestive of cardiac etiology given his multiple cardiovascular risk factors as noted above. EKG does not illustrate injury pattern. Echocardiogram will be ordered to evaluate for structural heart disease and left ventricular systolic function. Plan exercise nuclear stress  test to evaluate for chronotropic competence, functional status, and reversible ischemia. Educated on seeking medical attention sooner by going to the closest ER via EMS if the symptoms increase in intensity, frequency, duration, or has typical chest pain as discussed in the office.  Patient verbalized understanding.  Cigarette smoking: Tobacco cessation counseling: Currently smoking 1.5 packs/day , at least 50-year pack history of smoking Patient was informed of the dangers of tobacco abuse including stroke, cancer, and MI, as well as benefits of tobacco cessation. Patient is willing to quit at this time. Approximately 7 mins were spent counseling patient cessation techniques. We discussed various methods to help quit smoking, including deciding on a date to quit, joining a support group, pharmacological agents- nicotine gum/patch/lozenges, chantix.  I will reassess his progress at the next follow-up visit. I have asked him to discuss possibly undergoing pulmonary evaluation with his PCP to rule out underlying COPD/emphysema.  And may also benefit from lung cancer screening given his history of smoking  Benign essential hypertension: Blood pressures within acceptable range. Medications reconciled. Currently managed by primary care provider.   FINAL MEDICATION LIST END OF ENCOUNTER: No orders of the defined types were placed in this encounter.   Medications Discontinued During This Encounter  Medication Reason   naproxen (NAPROSYN) 500 MG tablet Error   ribavirin (REBETOL) 200 MG capsule Error   Sofosbuvir 400 MG TABS Error     Current Outpatient Medications:    atorvastatin (LIPITOR) 10 MG tablet, TK 1 T PO QD, Disp: , Rfl:    ibuprofen (ADVIL,MOTRIN) 200 MG tablet, Take 200 mg by mouth every 6 (six) hours as needed., Disp: , Rfl:    lisinopril-hydrochlorothiazide (PRINZIDE,ZESTORETIC) 10-12.5 MG per tablet, Take 1 tablet by mouth daily., Disp: , Rfl:   Orders Placed This Encounter   Procedures   CMP14+EGFR   Magnesium   PCV MYOCARDIAL PERFUSION WO LEXISCAN   LONG TERM MONITOR (3-14 DAYS)   EKG 12-Lead   PCV ECHOCARDIOGRAM COMPLETE    There are no Patient Instructions on file for this visit.   --Continue cardiac medications as reconciled in final medication list. --Return in about 6 weeks (around 03/29/2021) for Follow up bradycardia, discuss test results. Or sooner if needed. --Continue follow-up with your primary care physician regarding the management of your other chronic comorbid conditions.  Patient's questions and concerns were addressed to his satisfaction. He voices understanding of the instructions provided during this encounter.   This note was created using a voice recognition software as a result there may be grammatical errors inadvertently enclosed that do not reflect the nature of this encounter. Every attempt is made to correct such errors.  Rex Kras, [place], [facility]  Pager: [phone] Office: [phone]

## 2021-02-16 ENCOUNTER — Ambulatory Visit: Payer: Medicare Other

## 2021-02-16 DIAGNOSIS — R072 Precordial pain: Secondary | ICD-10-CM | POA: Diagnosis not present

## 2021-02-16 LAB — CMP14+EGFR
ALT: 14 IU/L (ref 0–44)
AST: 21 IU/L (ref 0–40)
Albumin/Globulin Ratio: 1.6 (ref 1.2–2.2)
Albumin: 4.3 g/dL (ref 3.8–4.8)
Alkaline Phosphatase: 66 IU/L (ref 44–121)
BUN/Creatinine Ratio: 11 (ref 10–24)
BUN: 14 mg/dL (ref 8–27)
Bilirubin Total: 0.5 mg/dL (ref 0.0–1.2)
CO2: 25 mmol/L (ref 20–29)
Calcium: 9.4 mg/dL (ref 8.6–10.2)
Chloride: 100 mmol/L (ref 96–106)
Creatinine, Ser: 1.25 mg/dL (ref 0.76–1.27)
Globulin, Total: 2.7 g/dL (ref 1.5–4.5)
Glucose: 99 mg/dL (ref 65–99)
Potassium: 5.1 mmol/L (ref 3.5–5.2)
Sodium: 137 mmol/L (ref 134–144)
Total Protein: 7 g/dL (ref 6.0–8.5)
eGFR: 63 mL/min/{1.73_m2} (ref >59)

## 2021-02-16 LAB — MAGNESIUM: Magnesium: 2.2 mg/dL (ref 1.6–2.3)

## 2021-03-03 NOTE — Progress Notes (Signed)
Spoke to patient he is aware of results and was reminded upcoming ov

## 2021-03-03 NOTE — Progress Notes (Signed)
Patient is aware of results he voiced understanding

## 2021-03-06 ENCOUNTER — Ambulatory Visit: Payer: Medicare Other

## 2021-03-06 ENCOUNTER — Inpatient Hospital Stay: Payer: Medicare Other

## 2021-03-06 ENCOUNTER — Other Ambulatory Visit: Payer: Self-pay

## 2021-03-06 DIAGNOSIS — R001 Bradycardia, unspecified: Secondary | ICD-10-CM | POA: Diagnosis not present

## 2021-03-06 DIAGNOSIS — R072 Precordial pain: Secondary | ICD-10-CM

## 2021-03-09 NOTE — Progress Notes (Signed)
Called pt to inform him about keeping his appt on 08/25. Pt understood

## 2021-03-24 DIAGNOSIS — R001 Bradycardia, unspecified: Secondary | ICD-10-CM | POA: Diagnosis not present

## 2021-03-30 ENCOUNTER — Encounter: Payer: Self-pay | Admitting: Cardiology

## 2021-03-30 ENCOUNTER — Ambulatory Visit: Payer: Medicare Other | Admitting: Cardiology

## 2021-03-30 ENCOUNTER — Other Ambulatory Visit: Payer: Self-pay

## 2021-03-30 VITALS — BP 131/86 | HR 63 | Temp 97.8°F | Resp 17 | Ht 68.0 in | Wt 199.0 lb

## 2021-03-30 DIAGNOSIS — I472 Ventricular tachycardia: Secondary | ICD-10-CM | POA: Diagnosis not present

## 2021-03-30 DIAGNOSIS — R001 Bradycardia, unspecified: Secondary | ICD-10-CM

## 2021-03-30 DIAGNOSIS — I10 Essential (primary) hypertension: Secondary | ICD-10-CM

## 2021-03-30 DIAGNOSIS — K7460 Unspecified cirrhosis of liver: Secondary | ICD-10-CM

## 2021-03-30 DIAGNOSIS — Z8619 Personal history of other infectious and parasitic diseases: Secondary | ICD-10-CM

## 2021-03-30 DIAGNOSIS — E782 Mixed hyperlipidemia: Secondary | ICD-10-CM

## 2021-03-30 DIAGNOSIS — I493 Ventricular premature depolarization: Secondary | ICD-10-CM | POA: Diagnosis not present

## 2021-03-30 DIAGNOSIS — F17210 Nicotine dependence, cigarettes, uncomplicated: Secondary | ICD-10-CM | POA: Diagnosis not present

## 2021-03-30 DIAGNOSIS — F17200 Nicotine dependence, unspecified, uncomplicated: Secondary | ICD-10-CM

## 2021-03-30 DIAGNOSIS — I4729 Other ventricular tachycardia: Secondary | ICD-10-CM

## 2021-03-30 DIAGNOSIS — R072 Precordial pain: Secondary | ICD-10-CM | POA: Diagnosis not present

## 2021-03-30 MED ORDER — METOPROLOL SUCCINATE ER 25 MG PO TB24: 25.0000 mg | ORAL_TABLET | Freq: Every morning | ORAL | 0 refills | Status: DC

## 2021-03-30 NOTE — Progress Notes (Signed)
Date:  03/30/2021   ID:  Hector Matthews, DOB Feb 13, 1951, MRN [id number]  PCP:  Michel Harrow, PA-C  Cardiologist:  Rex Kras, DO, [facility] (established care 02/16/2020)  Date: 03/30/21 Last Office Visit: 02/15/2021  Chief Complaint  Patient presents with   Bradycardia   Follow-up    6 weeks   Results   HPI  Hector Matthews is a 70 y.o. male who presents to the office with a chief complaint of " reevaluation of bradycardia and review test results." Patient's past medical history and cardiovascular risk factors include: Benign essential hypertension, tobacco use, hyperlipidemia, first-degree AV block, history of hepatitis C status posttreatment, cirrhosis, advanced age.  He is referred to the office at the request of Michel Harrow, PA-C for evaluation of Bradycardia.  Patient was referred to the office for evaluation of bradycardia which was noted at his prior hepatologist visit which prompted a visit to urgent care.  Clinically he was asymptomatic referred to cardiology for further evaluation and management.  In addition, patient was complaining of precordial pain with mixed features of cardiac and noncardiac symptoms and underwent an ischemic evaluation.  Results of the echocardiogram and stress test discussed with him in great detail and noted below for further reference.  Since last office visit patient states that he has not had any chest pain at rest or with effort related activities.  He denies any shortness of breath more than his baseline which she attributes to prolonged history of smoking.  He continues to smoke at least 1.5 packs/day.  FUNCTIONAL STATUS: No structured exercise program or daily routine.   ALLERGIES: No Known Allergies  MEDICATION LIST PRIOR TO VISIT: Current Meds  Medication Sig   atorvastatin (LIPITOR) 10 MG tablet TK 1 T PO QD   ibuprofen (ADVIL,MOTRIN) 200 MG tablet Take 200 mg by mouth every 6 (six) hours as needed.    lisinopril-hydrochlorothiazide (PRINZIDE,ZESTORETIC) 10-12.5 MG per tablet Take 1 tablet by mouth daily.   metoprolol succinate (TOPROL XL) 25 MG 24 hr tablet Take 1 tablet (25 mg total) by mouth every morning.     PAST MEDICAL HISTORY: Past Medical History:  Diagnosis Date   Allergy    Cirrhosis ([place])    Hepatitis C    Hyperlipidemia    Hypertension     PAST SURGICAL HISTORY: Past Surgical History:  Procedure Laterality Date   COLONOSCOPY     GANGLION CYST EXCISION Right    foot   POLYPECTOMY      FAMILY HISTORY: The patient family history includes Cancer in his father; Hypertension in his brother; Pancreatic cancer in his father.  SOCIAL HISTORY:  The patient  reports that he has been smoking cigarettes. He has a 33.00 pack-year smoking history. He has never used smokeless tobacco. He reports that he does not drink alcohol and does not use drugs.  REVIEW OF SYSTEMS: Review of Systems  Constitutional: Negative for chills and fever.  HENT:  Negative for hoarse voice and nosebleeds.   Eyes:  Negative for discharge, double vision and pain.  Cardiovascular:  Negative for chest pain, claudication, dyspnea on exertion, leg swelling, near-syncope, orthopnea, palpitations, paroxysmal nocturnal dyspnea and syncope.  Respiratory:  Positive for shortness of breath (chronic). Negative for hemoptysis.   Musculoskeletal:  Negative for muscle cramps and myalgias.  Gastrointestinal:  Negative for abdominal pain, constipation, diarrhea, hematemesis, hematochezia, melena, nausea and vomiting.  Neurological:  Negative for dizziness and light-headedness.   PHYSICAL EXAM: Vitals with BMI 03/30/2021 02/15/2021 03/06/2020  Height '5\' 8"'$  '5\' 8"'$  -  Weight 199 lbs 200 lbs -  BMI [id number] -  Systolic [id number] 94  Diastolic 86 88 61  Pulse 63 60 -    CONSTITUTIONAL: Age-appropriate, hemodynamically stable, well-developed. No acute distress.  SKIN: Skin is warm and dry. No rash noted. No cyanosis.  No pallor. No jaundice HEAD: Normocephalic and atraumatic.  EYES: No scleral icterus MOUTH/THROAT: Moist oral membranes.  NECK: No JVD present. No thyromegaly noted. No carotid bruits  LYMPHATIC: No visible cervical adenopathy.  CHEST Normal respiratory effort. No intercostal retractions  LUNGS: Decreased breath sounds bilaterally.  No stridor. No wheezes. No rales.  CARDIOVASCULAR: Bradycardia, positive S1-S2, no murmurs rubs or gallops appreciated. ABDOMINAL: Soft, nontender, nondistended, positive bowel sounds in all 4 quadrants, no apparent ascites.  EXTREMITIES: No peripheral edema, palpable DP and PT pulses bilaterally HEMATOLOGIC: No significant bruising NEUROLOGIC: Oriented to person, place, and time. Nonfocal. Normal muscle tone.  PSYCHIATRIC: Normal mood and affect. Normal behavior. Cooperative  CARDIAC DATABASE: EKG: 02/15/2021: Sinus bradycardia, 57 bpm rightward axis, low voltage, consider pulmonary disease, without underlying injury pattern.   Echocardiogram: 02/16/2021: Left ventricle cavity is normal in size and wall thickness. Normal global wall motion. Normal LV systolic function with EF 63%. Doppler evidence of grade I (impaired) diastolic dysfunction, normal LAP. Aneurysmal interatrial septum without 2D or color Doppler evidence of shunting. Moderate (Grade II) mitral regurgitation. Normal right atrial pressure.    Stress Testing: Lexiscan Tetrofosmin stress test 03/06/2021: Lexiscan nuclear stress test performed using 1-day protocol. Normal wall motion and thickening. Stress LVEF 50%. SPECT images show moderate sized area of decreased tracer uptake, both at rest and stress. In absence of wall motion abnormalities, this likely represents gut attenuation. Ischemic in this region cannot be excluded. Recommended clinical correlation. Low risk study.  Heart Catheterization: None  14 day extended Holter monitor: Dominant rhythm normal sinus. Burden of sinus  bradycardia approximately 5% Heart rate 41-214 bpm.  Avg HR 63 bpm. No atrial fibrillation, high grade AV block, pauses (3 seconds or longer). Total ventricular ectopic burden <1%. Isolated ventricular ectopic beats 1351 with a longest ventricular trigeminy episode approximately 25 seconds. 3 episodes of ventricular tachycardia: The fastest lasting 4 beats at a maximal rate of 214 bpm and the longest episode lasting 6 beats at an average of 136 bpm. Total supraventricular ectopic burden <1%.  The fastest episode of SVT was 5 beats in duration at 190 bpm. Patient triggered events: 0.  LABORATORY DATA: No flowsheet data found.  CMP Latest Ref Rng & Units 02/15/2021  Glucose 65 - 99 mg/dL 99  BUN 8 - 27 mg/dL 14  Creatinine 0.76 - 1.27 mg/dL 1.25  Sodium 134 - 144 mmol/L 137  Potassium 3.5 - 5.2 mmol/L 5.1  Chloride 96 - 106 mmol/L 100  CO2 20 - 29 mmol/L 25  Calcium 8.6 - 10.2 mg/dL 9.4  Total Protein 6.0 - 8.5 g/dL 7.0  Total Bilirubin 0.0 - 1.2 mg/dL 0.5  Alkaline Phos 44 - 121 IU/L 66  AST 0 - 40 IU/L 21  ALT 0 - 44 IU/L 14    Lipid Panel  No results found for: CHOL, TRIG, HDL, CHOLHDL, VLDL, LDLCALC, LDLDIRECT, LABVLDL  No components found for: NTPROBNP No results for input(s): PROBNP in the last 8760 hours. No results for input(s): TSH in the last 8760 hours.  BMP Recent Labs    02/15/21 1153  NA 137  K 5.1  CL 100  CO2 25  GLUCOSE 99  BUN 14  CREATININE 1.25  CALCIUM 9.4    HEMOGLOBIN A1C No results found for: HGBA1C, MPG  External Labs: 12/12/2020: Hemoglobin 14.1 g/dL, hematocrit 41.1% Sodium 138, potassium 4.3, chloride 104, bicarb 29, BUN 14, creatinine 1.31 mg/dL. AST 18, ALT 11, alkaline phosphatase 52 Total cholesterol 106, triglycerides 57, HDL 35, LDL 58, non-HDL 71  02/10/2021: TSH 1.9  IMPRESSION:    ICD-10-CM   1. NSVT (nonsustained ventricular tachycardia) (HCC)  I47.2 metoprolol succinate (TOPROL XL) 25 MG 24 hr tablet    2. PVC's  (premature ventricular contractions)  I49.3     3. Bradycardia  R00.1     4. Precordial pain  R07.2     5. Benign hypertension  I10     6. Mixed hyperlipidemia  E78.2     7. Smoking  F17.200     8. Hx of hepatitis C  Z86.19     9. Cirrhosis of liver without ascites  K74.60        RECOMMENDATIONS: Hector Matthews is a 70 y.o. male whose past medical history and cardiac risk factors include: Benign essential hypertension, tobacco use, hyperlipidemia, first-degree AV block, history of hepatitis C status posttreatment, cirrhosis, advanced age.  Nonsustained ventricular tachycardia and premature ventricular contractions: On the Holter monitor that was performed as a part of his bradycardia work-up he was noted to have 3 episodes of NSVT.  The fastest being 4 beats in duration and the longest episode being 6 beats in duration all of which were asymptomatic. He has undergone an ischemic evaluation which is overall a low risk study. He has not had any reoccurrence of chest pain since last office visit. We will start him on Toprol-XL 25 mg p.o. every morning.  Bradycardia: Asymptomatic. Underwent an extended Holter monitor which notes an average heart rate of 63 bpm and a variability between 41-214 bpm. Patient also has episodes of premature ventricular contractions that may be contributing to his low pulse rate. We will start Toprol-XL as discussed above. Continue to monitor.  Precordial pain: Resolved No recurrence since last visit. Echocardiogram and stress test results reviewed with him in great detail and noted above for further reference Continue to monitor.  Hyperlipidemia: Continue statin therapy.  Currently managed by primary care provider.  Cigarette smoking: Tobacco cessation counseling: Currently smoking 1.5 packs/day , at least 50-year pack history of smoking Patient was informed of the dangers of tobacco abuse including stroke, cancer, and MI, as well as benefits of  tobacco cessation. Patient is willing to quit at this time. Approximately 7 mins were spent counseling patient cessation techniques. We discussed various methods to help quit smoking, including deciding on a date to quit, joining a support group, pharmacological agents- nicotine gum/patch/lozenges, chantix.  I will reassess his progress at the next follow-up visit. I have asked him to discuss possibly undergoing pulmonary evaluation with his PCP to rule out underlying COPD/emphysema.  And may also benefit from lung cancer screening given his history of smoking   FINAL MEDICATION LIST END OF ENCOUNTER: Meds ordered this encounter  Medications   metoprolol succinate (TOPROL XL) 25 MG 24 hr tablet    Sig: Take 1 tablet (25 mg total) by mouth every morning.    Dispense:  90 tablet    Refill:  0     There are no discontinued medications.    Current Outpatient Medications:    atorvastatin (LIPITOR) 10 MG tablet, TK 1 T PO QD, Disp: , Rfl:  ibuprofen (ADVIL,MOTRIN) 200 MG tablet, Take 200 mg by mouth every 6 (six) hours as needed., Disp: , Rfl:    lisinopril-hydrochlorothiazide (PRINZIDE,ZESTORETIC) 10-12.5 MG per tablet, Take 1 tablet by mouth daily., Disp: , Rfl:    metoprolol succinate (TOPROL XL) 25 MG 24 hr tablet, Take 1 tablet (25 mg total) by mouth every morning., Disp: 90 tablet, Rfl: 0  No orders of the defined types were placed in this encounter.   There are no Patient Instructions on file for this visit.   --Continue cardiac medications as reconciled in final medication list. --Return in about 3 months (around 06/30/2021) for Follow up PVC and NSVT. Or sooner if needed. --Continue follow-up with your primary care physician regarding the management of your other chronic comorbid conditions.  Patient's questions and concerns were addressed to his satisfaction. He voices understanding of the instructions provided during this encounter.   This note was created using a voice  recognition software as a result there may be grammatical errors inadvertently enclosed that do not reflect the nature of this encounter. Every attempt is made to correct such errors.  Rex Kras, [place], [facility]  Pager: [phone] Office: [phone]

## 2021-05-12 IMAGING — US US ABDOMEN LIMITED
1 series · 14 of 25 positions shown · non-contrast
Comparison: Numerous prior ultrasound examinations. The most recent
is 01/21/2020

CLINICAL DATA: Cirrhosis.

EXAM:
ULTRASOUND ABDOMEN LIMITED RIGHT UPPER QUADRANT

[Series 1: us abdomen limited · 0.22mm/px · 14 of 54 slices shown]
[im 1/54]
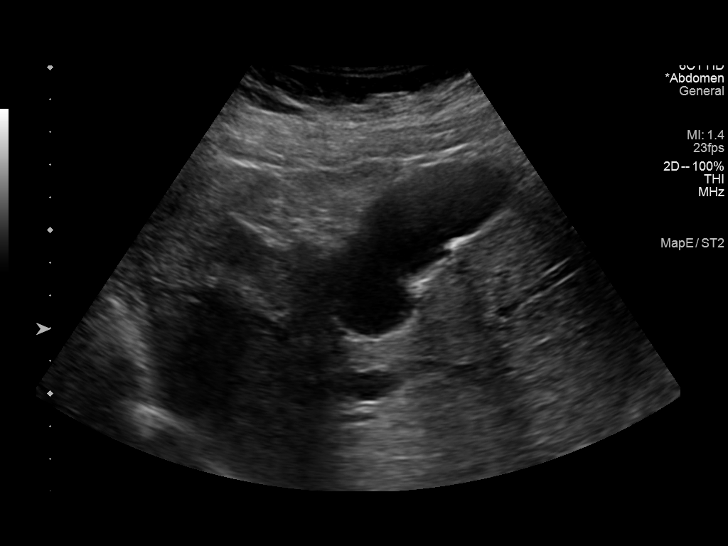
[im 5/54]
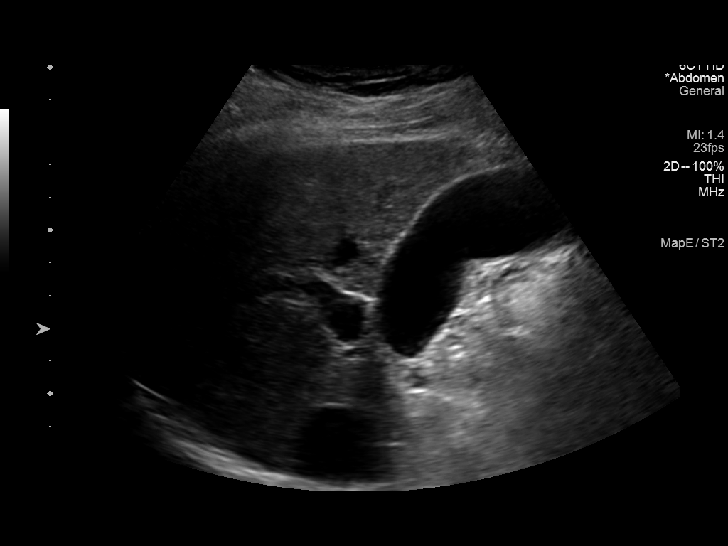
[im 9/54]
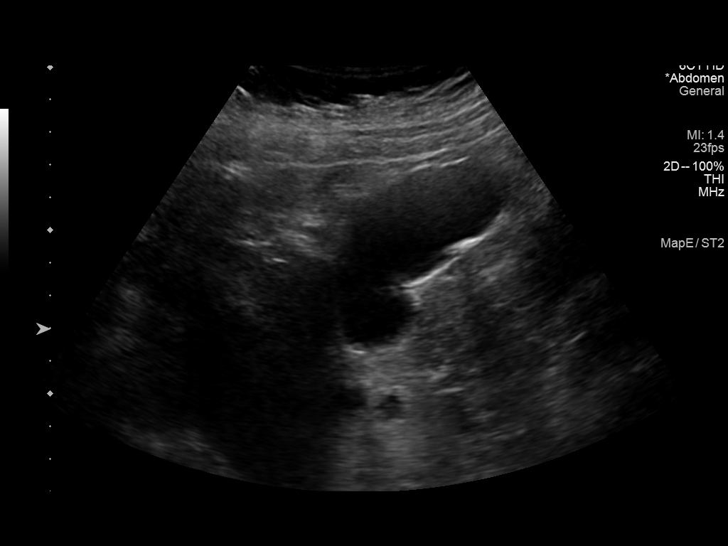
[im 14/54]
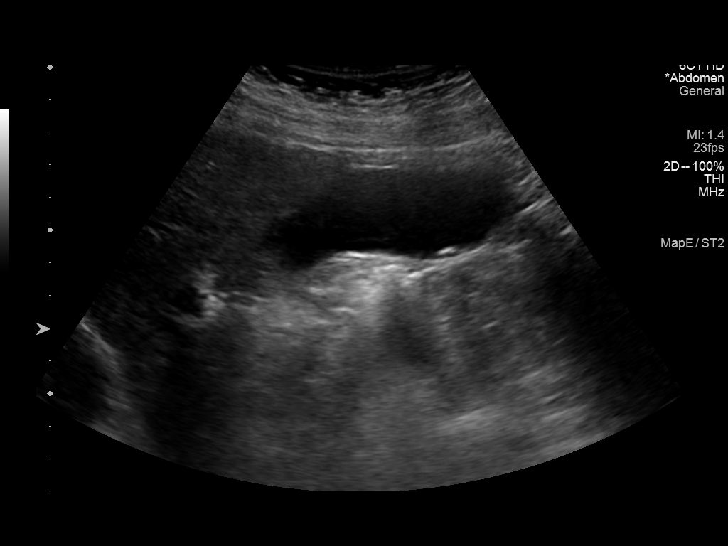
[im 18/54]
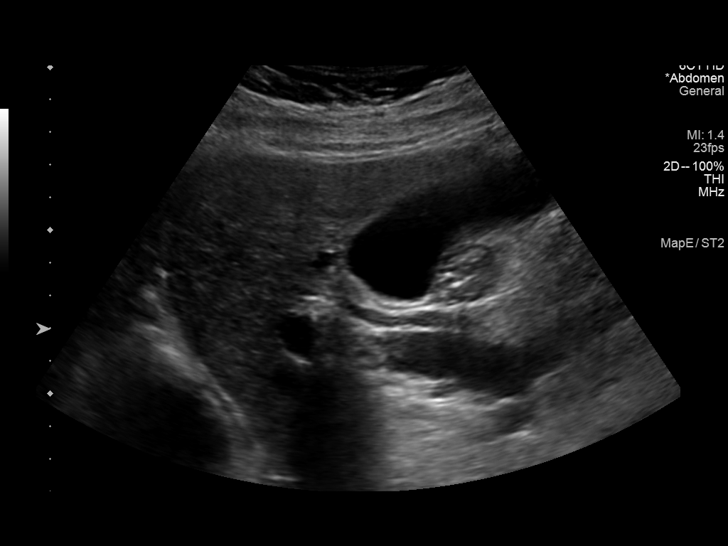
[im 20/54]
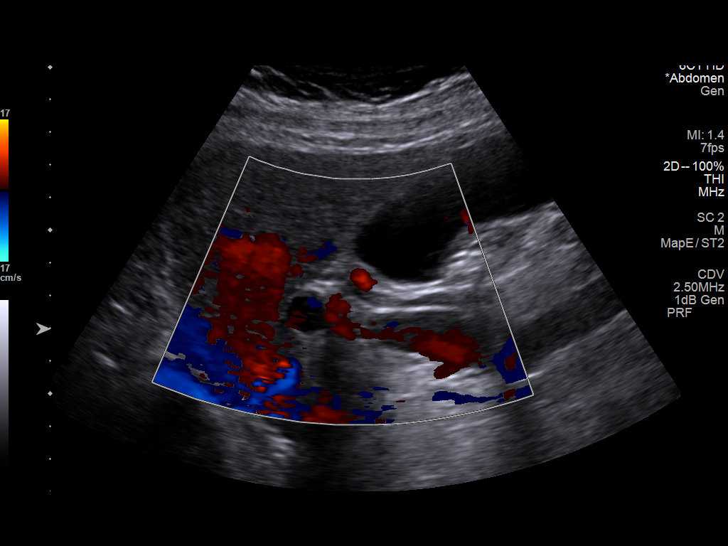
[im 25/54]
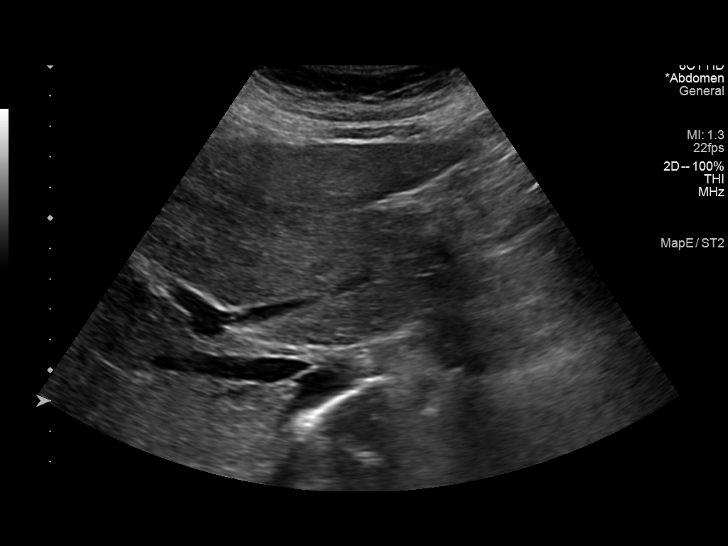
[im 29/54]
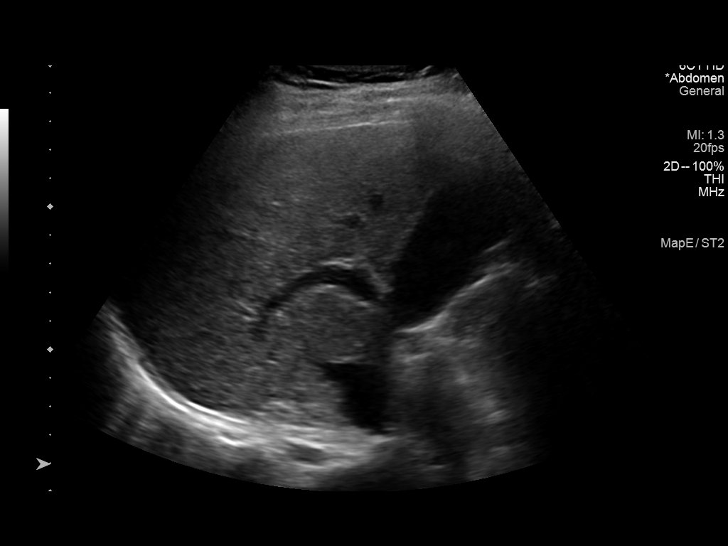
[im 34/54]
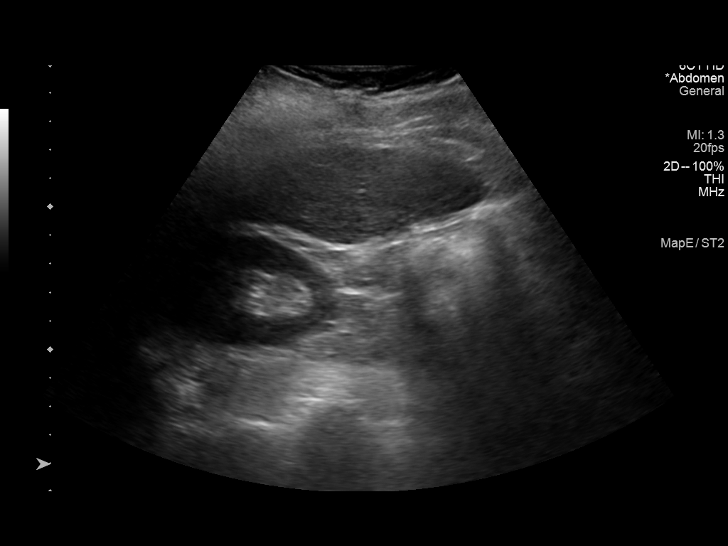
[im 36/54]
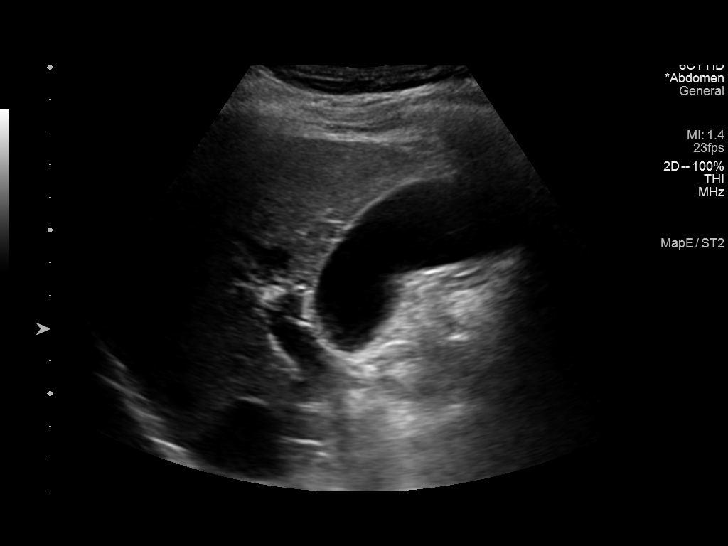
[im 40/54]
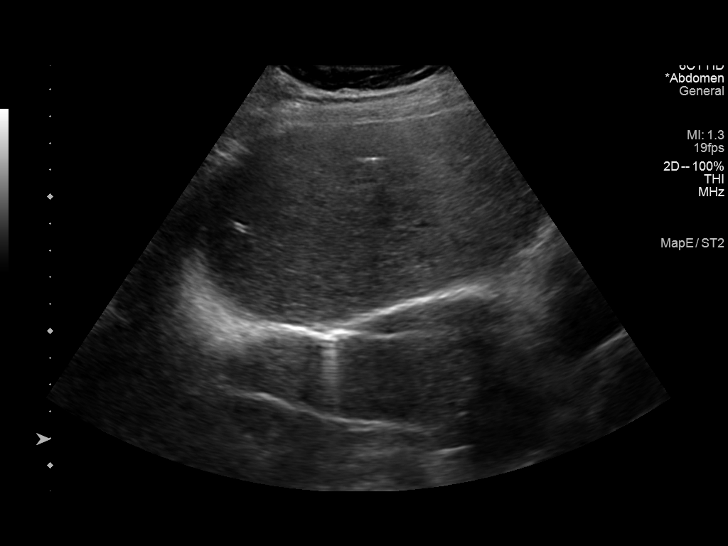
[im 45/54]
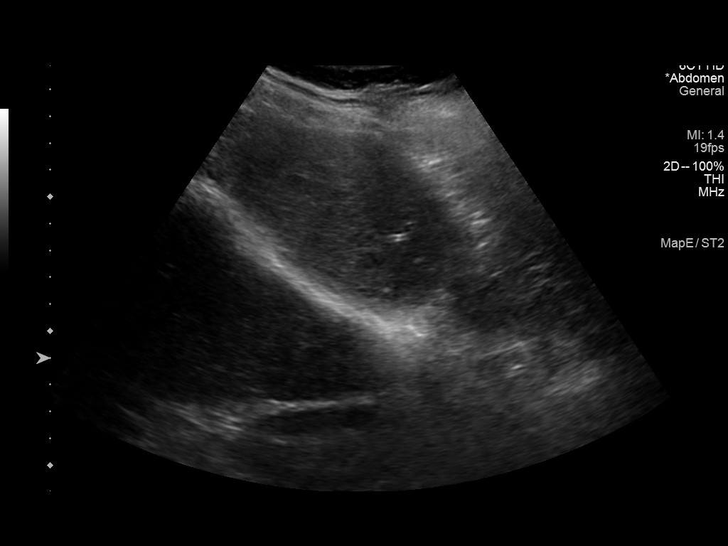
[im 49/54]
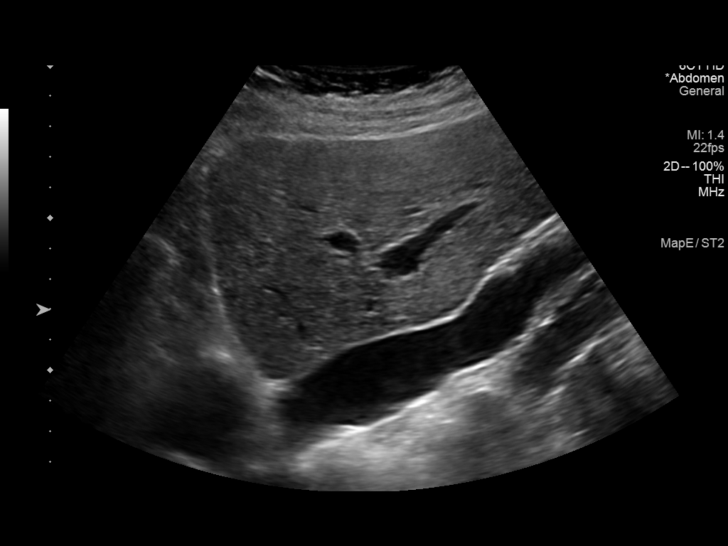
[im 54/54]
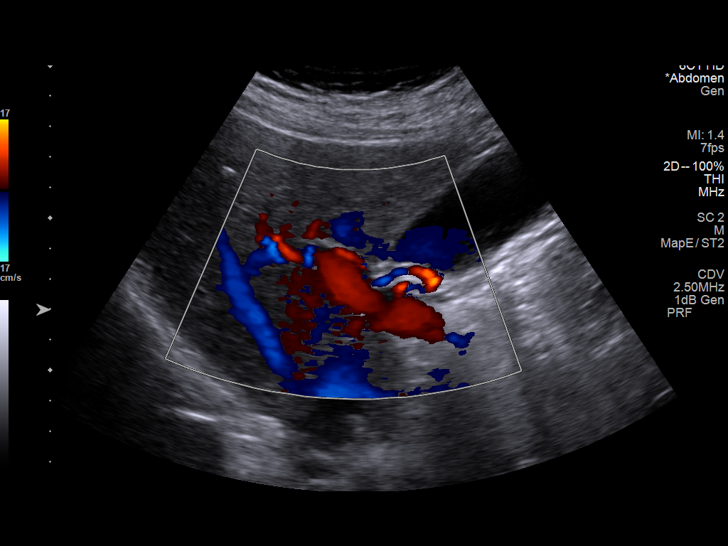

[14 of 25 positions shown; findings below may reference images not displayed]

FINDINGS: Gallbladder:

Stable mobile gallstones in the gallbladder. No gallbladder wall
thickening, pericholecystic fluid or sonographic Murphy sign.

Common bile duct:

Diameter: 3.3 mm.

Liver:

Stable cirrhotic changes involving the liver but no focal hepatic
lesions are identified. Portal vein is patent on color Doppler
imaging with normal direction of blood flow towards the liver.

Other: No ascites.
IMPRESSION: 1. Stable cirrhotic changes involving the liver but no focal hepatic
lesions are identified.
2. Normal directional blood flow in the liver.
3. Cholelithiasis.  No biliary dilatation.

## 2021-06-19 DIAGNOSIS — Z23 Encounter for immunization: Secondary | ICD-10-CM | POA: Diagnosis not present

## 2021-06-19 DIAGNOSIS — K7460 Unspecified cirrhosis of liver: Secondary | ICD-10-CM | POA: Diagnosis not present

## 2021-06-19 DIAGNOSIS — I10 Essential (primary) hypertension: Secondary | ICD-10-CM | POA: Diagnosis not present

## 2021-06-19 LAB — BASIC METABOLIC PANEL
BUN: 16 (ref 4–21)
CO2: 30 — AB (ref 13–22)
Chloride: 105 (ref 99–108)
Creatinine: 1.2 (ref 0.6–1.3)
Glucose: 83
Potassium: 4.8 mEq/L (ref 3.5–5.1)
Sodium: 139 (ref 137–147)

## 2021-06-19 LAB — HEPATIC FUNCTION PANEL
ALT: 14 U/L (ref 10–40)
AST: 20 (ref 14–40)

## 2021-06-19 LAB — COMPREHENSIVE METABOLIC PANEL
Albumin: 3.9 (ref 3.5–5.0)
Calcium: 9.1 (ref 8.7–10.7)
eGFR: 66

## 2021-07-07 ENCOUNTER — Ambulatory Visit: Payer: Medicare Other | Admitting: Cardiology

## 2021-08-11 ENCOUNTER — Ambulatory Visit: Payer: Medicare Other | Admitting: Cardiology

## 2021-08-11 ENCOUNTER — Encounter: Payer: Self-pay | Admitting: Cardiology

## 2021-08-11 ENCOUNTER — Other Ambulatory Visit: Payer: Self-pay

## 2021-08-11 VITALS — BP 132/78 | HR 54 | Temp 97.3°F | Resp 16 | Ht 68.0 in | Wt 202.8 lb

## 2021-08-11 DIAGNOSIS — I10 Essential (primary) hypertension: Secondary | ICD-10-CM | POA: Diagnosis not present

## 2021-08-11 DIAGNOSIS — R001 Bradycardia, unspecified: Secondary | ICD-10-CM | POA: Diagnosis not present

## 2021-08-11 DIAGNOSIS — I4729 Other ventricular tachycardia: Secondary | ICD-10-CM

## 2021-08-11 DIAGNOSIS — I493 Ventricular premature depolarization: Secondary | ICD-10-CM | POA: Diagnosis not present

## 2021-08-11 DIAGNOSIS — E782 Mixed hyperlipidemia: Secondary | ICD-10-CM | POA: Diagnosis not present

## 2021-08-11 DIAGNOSIS — F17200 Nicotine dependence, unspecified, uncomplicated: Secondary | ICD-10-CM | POA: Diagnosis not present

## 2021-08-11 NOTE — Progress Notes (Signed)
ID:  Hector Matthews, DOB Mar 15, 1951, MRN [id number]  PCP:  Michel Harrow, PA-C  Cardiologist:  Rex Kras, DO, [facility] (established care 02/16/2020)  Date: 08/11/21 Last Office Visit: 03/30/2021  Chief Complaint  Patient presents with   Follow-up    Recent medication changes for NSVT and PVCs.   HPI  Hector Matthews is a 71 y.o. male who presents to the office with a chief complaint of " recent medication changes." Patient's past medical history and cardiovascular risk factors include: Benign essential hypertension, tobacco use, hyperlipidemia, first-degree AV block, history of hepatitis C status posttreatment, cirrhosis, advanced age.  He is referred to the office at the request of Michel Harrow, PA-C for evaluation of Bradycardia.  Initially referred to the office for evaluation of bradycardia by his hepatologist.  Underwent extended Holter monitor and noted to have an average heart rate of 63 bpm, PVCs with total burden still less than 1%, and asymptomatic NSVT.  Patient was started on Toprol-XL at the last office visit and has tolerated the medication well without any side effects or intolerances.  No other identifiable causes of his bradycardia identified prior to initiating AV nodal blocking agents.  Review of systems were also positive for chest pain; in addition, due to NSVT extended Holter monitor and multiple cardiovascular risk factors patient also underwent echocardiogram and stress test.  Echocardiogram notes preserved LVEF and overall stress test is noted to be low risk.  Since last office visit patient denies any cardiovascular symptoms of chest pain/angina pectoris.  Shortness of breath remains chronic and stable.  He unfortunately continues to smoke 1.5 packs/day.   FUNCTIONAL STATUS: No structured exercise program or daily routine.   ALLERGIES: No Known Allergies  MEDICATION LIST PRIOR TO VISIT: Current Meds  Medication Sig   atorvastatin (LIPITOR) 10 MG  tablet TK 1 T PO QD   ibuprofen (ADVIL,MOTRIN) 200 MG tablet Take 200 mg by mouth every 6 (six) hours as needed.   lisinopril-hydrochlorothiazide (PRINZIDE,ZESTORETIC) 10-12.5 MG per tablet Take 1 tablet by mouth daily.   metoprolol succinate (TOPROL XL) 25 MG 24 hr tablet Take 1 tablet (25 mg total) by mouth every morning.     PAST MEDICAL HISTORY: Past Medical History:  Diagnosis Date   Allergy    Cirrhosis ([place])    Hepatitis C    Hyperlipidemia    Hypertension     PAST SURGICAL HISTORY: Past Surgical History:  Procedure Laterality Date   COLONOSCOPY     GANGLION CYST EXCISION Right    foot   POLYPECTOMY      FAMILY HISTORY: The patient family history includes Cancer in his father; Hypertension in his brother; Pancreatic cancer in his father.  SOCIAL HISTORY:  The patient  reports that he has been smoking cigarettes. He has a 33.00 pack-year smoking history. He has never used smokeless tobacco. He reports that he does not drink alcohol and does not use drugs.  REVIEW OF SYSTEMS: Review of Systems  Constitutional: Negative for chills and fever.  HENT:  Negative for hoarse voice and nosebleeds.   Eyes:  Negative for discharge, double vision and pain.  Cardiovascular:  Negative for chest pain, claudication, dyspnea on exertion, leg swelling, near-syncope, orthopnea, palpitations, paroxysmal nocturnal dyspnea and syncope.  Respiratory:  Positive for shortness of breath (chronic). Negative for hemoptysis.   Musculoskeletal:  Negative for muscle cramps and myalgias.  Gastrointestinal:  Negative for abdominal pain, constipation, diarrhea, hematemesis, hematochezia, melena, nausea and vomiting.  Neurological:  Negative  for dizziness and light-headedness.   PHYSICAL EXAM: Vitals with BMI 08/11/2021 03/30/2021 02/15/2021  Height 5\' 8"  5\' 8"  5\' 8"   Weight 202 lbs 13 oz 199 lbs 200 lbs  BMI 30.84 27.51 70.01  Systolic 749 449 675  Diastolic 78 86 88  Pulse 54 63 60     CONSTITUTIONAL: Age-appropriate, hemodynamically stable, well-developed. No acute distress.  SKIN: Skin is warm and dry. No rash noted. No cyanosis. No pallor. No jaundice HEAD: Normocephalic and atraumatic.  EYES: No scleral icterus MOUTH/THROAT: Moist oral membranes.  NECK: No JVD present. No thyromegaly noted. No carotid bruits  LYMPHATIC: No visible cervical adenopathy.  CHEST Normal respiratory effort. No intercostal retractions  LUNGS: Decreased breath sounds bilaterally.  No stridor. No wheezes. No rales.  CARDIOVASCULAR: Bradycardia, positive S1-S2, no murmurs rubs or gallops appreciated. ABDOMINAL: Soft, nontender, nondistended, positive bowel sounds in all 4 quadrants, no apparent ascites.  EXTREMITIES: No peripheral edema, palpable DP and PT pulses bilaterally HEMATOLOGIC: No significant bruising NEUROLOGIC: Oriented to person, place, and time. Nonfocal. Normal muscle tone.  PSYCHIATRIC: Normal mood and affect. Normal behavior. Cooperative  CARDIAC DATABASE: EKG: 08/11/2021: Sinus bradycardia, 51 bpm, normal axis, without underlying injury pattern.  Echocardiogram: 02/16/2021: Left ventricle cavity is normal in size and wall thickness. Normal global wall motion. Normal LV systolic function with EF 63%. Doppler evidence of grade I (impaired) diastolic dysfunction, normal LAP. Aneurysmal interatrial septum without 2D or color Doppler evidence of shunting. Moderate (Grade II) mitral regurgitation. Normal right atrial pressure.    Stress Testing: Lexiscan Tetrofosmin stress test 03/06/2021: Lexiscan nuclear stress test performed using 1-day protocol. Normal wall motion and thickening. Stress LVEF 50%. SPECT images show moderate sized area of decreased tracer uptake, both at rest and stress. In absence of wall motion abnormalities, this likely represents gut attenuation. Ischemic in this region cannot be excluded. Recommended clinical correlation. Low risk study.  Heart  Catheterization: None  14 day extended Holter monitor: Dominant rhythm normal sinus. Burden of sinus bradycardia approximately 5% Heart rate 41-214 bpm.  Avg HR 63 bpm. No atrial fibrillation, high grade AV block, pauses (3 seconds or longer). Total ventricular ectopic burden <1%. Isolated ventricular ectopic beats 1351 with a longest ventricular trigeminy episode approximately 25 seconds. 3 episodes of ventricular tachycardia: The fastest lasting 4 beats at a maximal rate of 214 bpm and the longest episode lasting 6 beats at an average of 136 bpm. Total supraventricular ectopic burden <1%.  The fastest episode of SVT was 5 beats in duration at 190 bpm. Patient triggered events: 0.  LABORATORY DATA: No flowsheet data found.  CMP Latest Ref Rng & Units 02/15/2021  Glucose 65 - 99 mg/dL 99  BUN 8 - 27 mg/dL 14  Creatinine 0.76 - 1.27 mg/dL 1.25  Sodium 134 - 144 mmol/L 137  Potassium 3.5 - 5.2 mmol/L 5.1  Chloride 96 - 106 mmol/L 100  CO2 20 - 29 mmol/L 25  Calcium 8.6 - 10.2 mg/dL 9.4  Total Protein 6.0 - 8.5 g/dL 7.0  Total Bilirubin 0.0 - 1.2 mg/dL 0.5  Alkaline Phos 44 - 121 IU/L 66  AST 0 - 40 IU/L 21  ALT 0 - 44 IU/L 14    Lipid Panel  No results found for: CHOL, TRIG, HDL, CHOLHDL, VLDL, LDLCALC, LDLDIRECT, LABVLDL  No components found for: NTPROBNP No results for input(s): PROBNP in the last 8760 hours. No results for input(s): TSH in the last 8760 hours.  BMP Recent Labs    02/15/21  1153  NA 137  K 5.1  CL 100  CO2 25  GLUCOSE 99  BUN 14  CREATININE 1.25  CALCIUM 9.4    HEMOGLOBIN A1C No results found for: HGBA1C, MPG  External Labs: 12/12/2020: Hemoglobin 14.1 g/dL, hematocrit 41.1% Sodium 138, potassium 4.3, chloride 104, bicarb 29, BUN 14, creatinine 1.31 mg/dL. AST 18, ALT 11, alkaline phosphatase 52 Total cholesterol 106, triglycerides 57, HDL 35, LDL 58, non-HDL 71  02/10/2021: TSH 1.9  IMPRESSION:    ICD-10-CM   1. Bradycardia  R00.1 EKG  12-Lead    2. NSVT (nonsustained ventricular tachycardia)  I47.29 EKG 12-Lead    3. PVC's (premature ventricular contractions)  I49.3     4. Benign hypertension  I10     5. Mixed hyperlipidemia  E78.2     6. Smoking  F17.200        RECOMMENDATIONS: Giovany Cosby is a 71 y.o. male whose past medical history and cardiac risk factors include: Benign essential hypertension, tobacco use, hyperlipidemia, first-degree AV block, history of hepatitis C status posttreatment, cirrhosis, advanced age.  Asymptomatic bradycardia /asymptomatic NSVT/PVCs: Asymptomatic. EKG: Sinus bradycardia without underlying injury Echo: LVEF 41%, grade 1 diastolic dysfunction, normal left atrial pressure, moderate MR MPI: Low risk study Given his PVCs, NSVT,Started low-dose Toprol-XL 25 mg p.o. daily at the last office visit.  He is tolerated the medication well without any side effects or intolerances. Continue current medical therapy.  Benign hypertension Office blood pressures are well controlled. Reemphasized importance of low-salt diet. Medications reconciled. Currently managed by primary care provider.  Mixed hyperlipidemia Currently on atorvastatin.   He denies myalgia or other side effects. Most recent lipids dated 12/2020 reviewed as noted above. LDL within acceptable range. Currently managed by primary care provider.  Smoking Tobacco cessation counseling: Currently smoking <1.5 packs/day, at least 50-year pack history of smoking Patient was informed of the dangers of tobacco abuse including stroke, cancer, and MI, as well as benefits of tobacco cessation. Patient is willing to quit at this time. 5 mins were spent counseling patient cessation techniques. We discussed various methods to help quit smoking, including deciding on a date to quit, joining a support group, pharmacological agents- nicotine gum/patch/lozenges.  I will reassess his progress at the next follow-up visit. I have asked him  to discuss possibly undergoing pulmonary evaluation with his PCP to rule out underlying COPD/emphysema.  And may also benefit from lung cancer screening given his history of smoking  FINAL MEDICATION LIST END OF ENCOUNTER: No orders of the defined types were placed in this encounter.    There are no discontinued medications.    Current Outpatient Medications:    atorvastatin (LIPITOR) 10 MG tablet, TK 1 T PO QD, Disp: , Rfl:    ibuprofen (ADVIL,MOTRIN) 200 MG tablet, Take 200 mg by mouth every 6 (six) hours as needed., Disp: , Rfl:    lisinopril-hydrochlorothiazide (PRINZIDE,ZESTORETIC) 10-12.5 MG per tablet, Take 1 tablet by mouth daily., Disp: , Rfl:    metoprolol succinate (TOPROL XL) 25 MG 24 hr tablet, Take 1 tablet (25 mg total) by mouth every morning., Disp: 90 tablet, Rfl: 0  Orders Placed This Encounter  Procedures   EKG 12-Lead   There are no Patient Instructions on file for this visit.   --Continue cardiac medications as reconciled in final medication list. --Return in about 6 months (around 02/08/2022) for Follow up asymptomatic bradycardia. Or sooner if needed. --Continue follow-up with your primary care physician regarding the management of your other  chronic comorbid conditions.  Patient's questions and concerns were addressed to his satisfaction. He voices understanding of the instructions provided during this encounter.   This note was created using a voice recognition software as a result there may be grammatical errors inadvertently enclosed that do not reflect the nature of this encounter. Every attempt is made to correct such errors.  Rex Kras, [place], [facility]  Pager: [phone] Office: [phone]

## 2021-08-12 ENCOUNTER — Encounter: Payer: Self-pay | Admitting: Cardiology

## 2021-09-22 ENCOUNTER — Other Ambulatory Visit: Payer: Self-pay | Admitting: Nurse Practitioner

## 2021-09-22 DIAGNOSIS — I440 Atrioventricular block, first degree: Secondary | ICD-10-CM | POA: Insufficient documentation

## 2021-09-22 DIAGNOSIS — K7469 Other cirrhosis of liver: Secondary | ICD-10-CM | POA: Diagnosis not present

## 2021-09-22 DIAGNOSIS — R001 Bradycardia, unspecified: Secondary | ICD-10-CM | POA: Insufficient documentation

## 2021-10-20 ENCOUNTER — Ambulatory Visit
Admission: RE | Admit: 2021-10-20 | Discharge: 2021-10-20 | Disposition: A | Discharge status: Home / Self Care | Payer: Medicare Other | Source: Ambulatory Visit | Attending: Nurse Practitioner | Admitting: Nurse Practitioner

## 2021-10-20 DIAGNOSIS — K7460 Unspecified cirrhosis of liver: Secondary | ICD-10-CM | POA: Diagnosis not present

## 2021-10-20 DIAGNOSIS — K7469 Other cirrhosis of liver: Secondary | ICD-10-CM

## 2021-10-28 IMAGING — US US ABDOMEN LIMITED
1 series · 14 of 25 positions shown · non-contrast
Comparison: 08/25/2020, 07/04/2017

CLINICAL DATA: Chronic hepatitis C, concern for cirrhosis

EXAM:
ULTRASOUND ABDOMEN LIMITED RIGHT UPPER QUADRANT

[Series 1: us abdomen limited · 0.22mm/px · 14 of 60 slices shown]
[im 1/60]
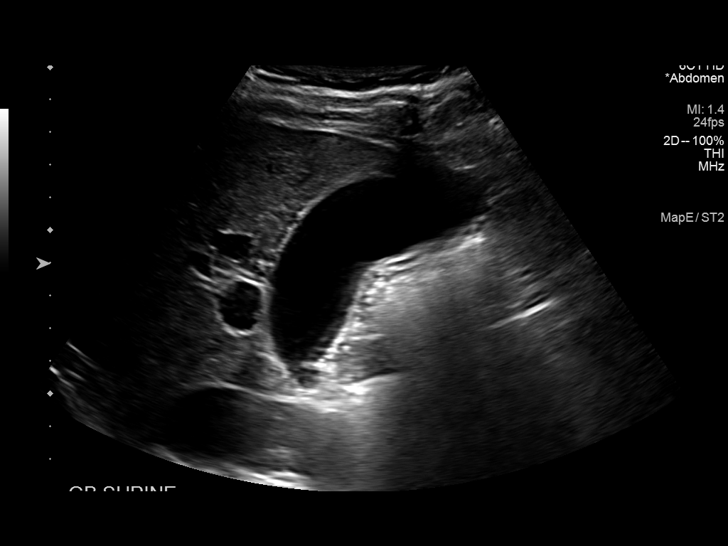
[im 5/60]
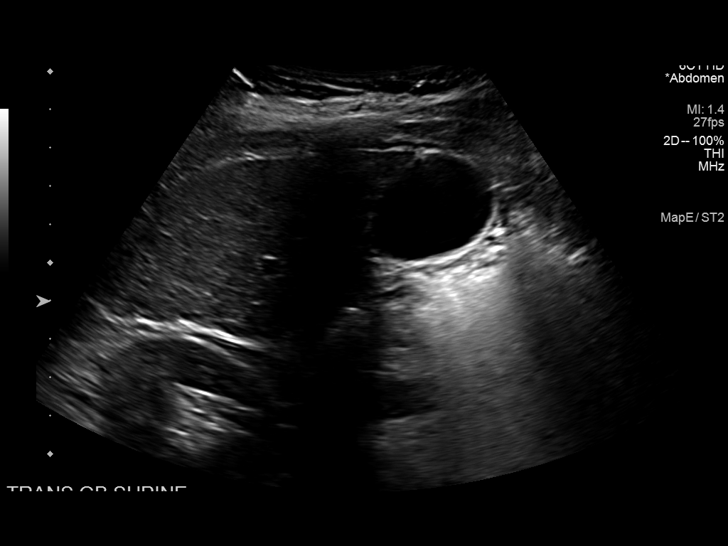
[im 10/60]
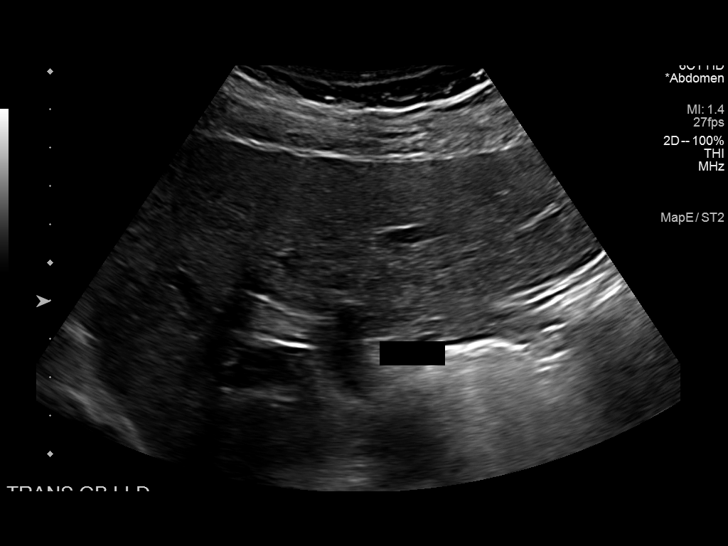
[im 15/60]
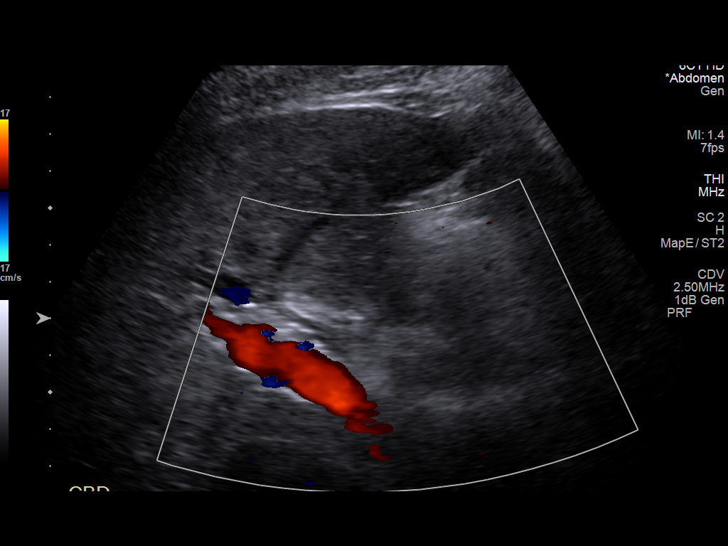
[im 20/60]
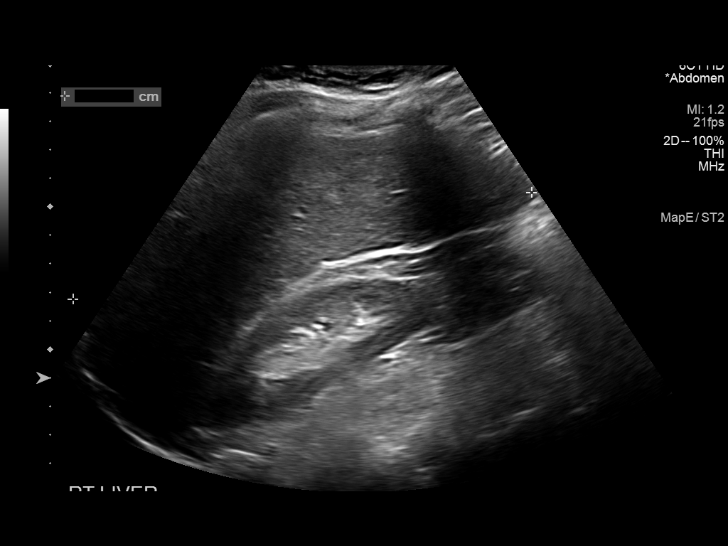
[im 23/60]
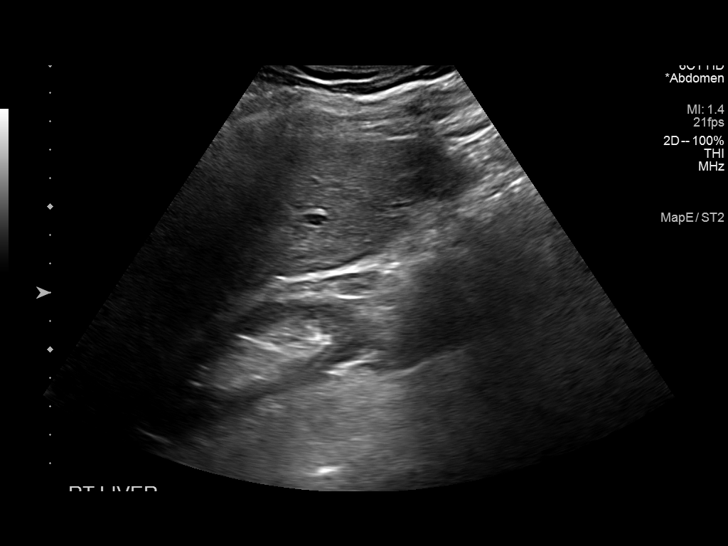
[im 28/60]
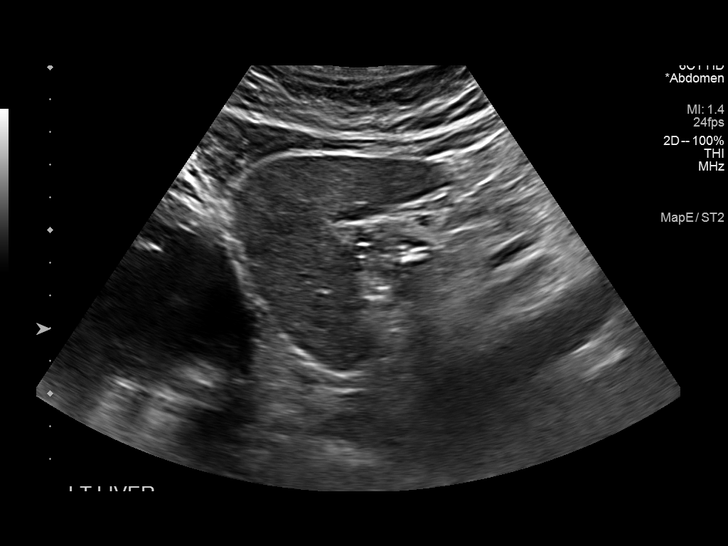
[im 32/60]
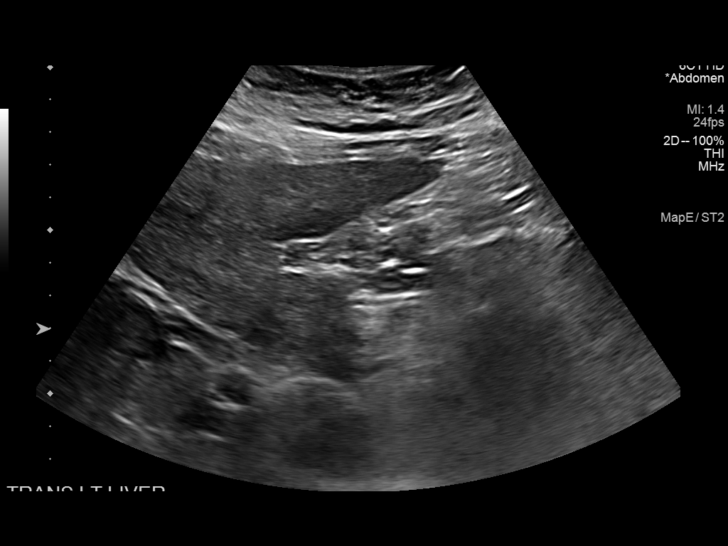
[im 37/60]
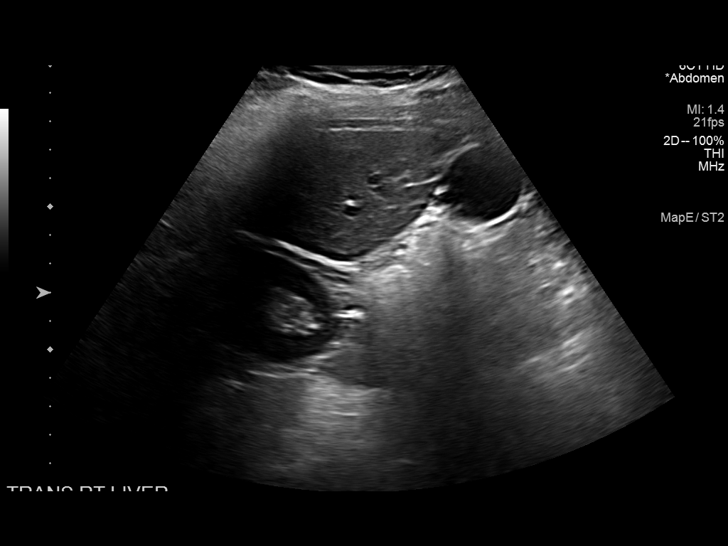
[im 40/60]
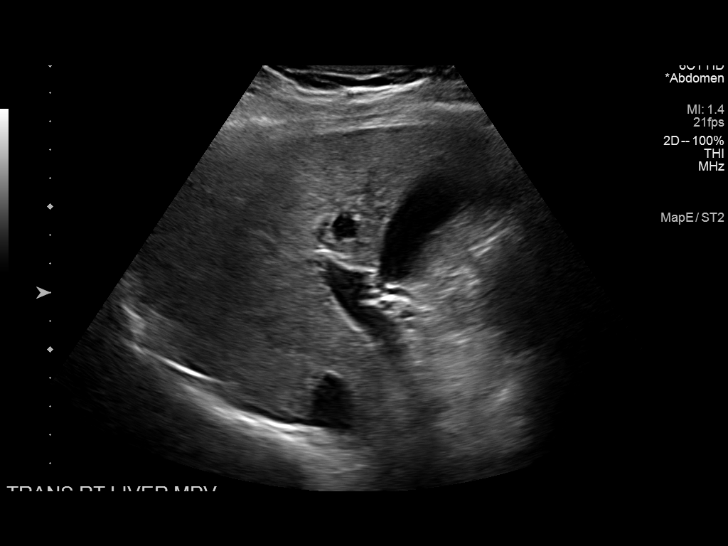
[im 45/60]
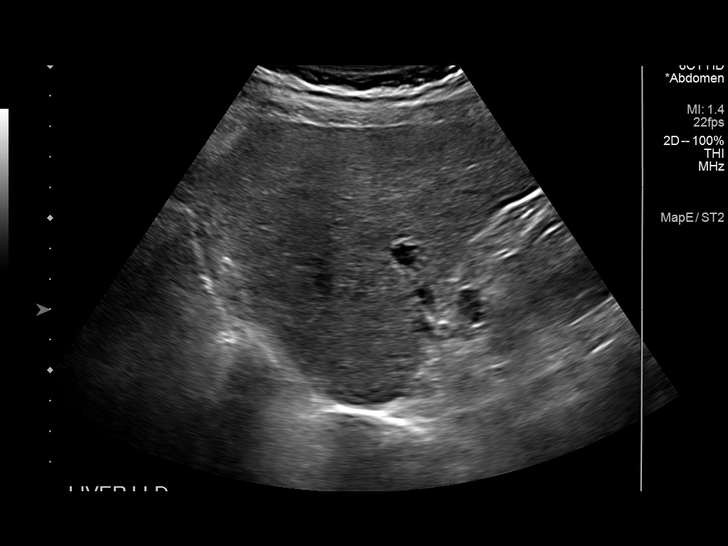
[im 50/60]
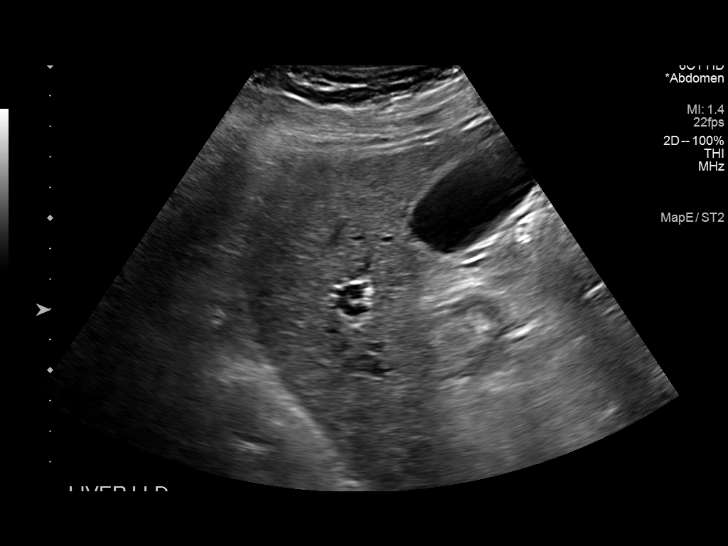
[im 55/60]
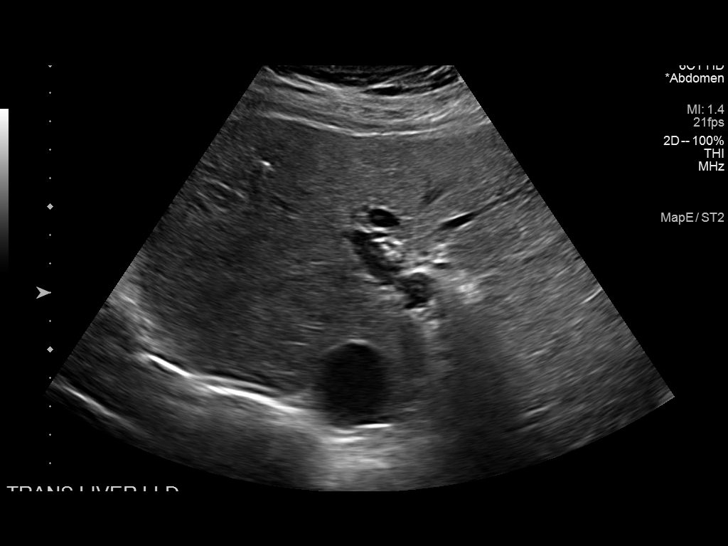
[im 60/60]
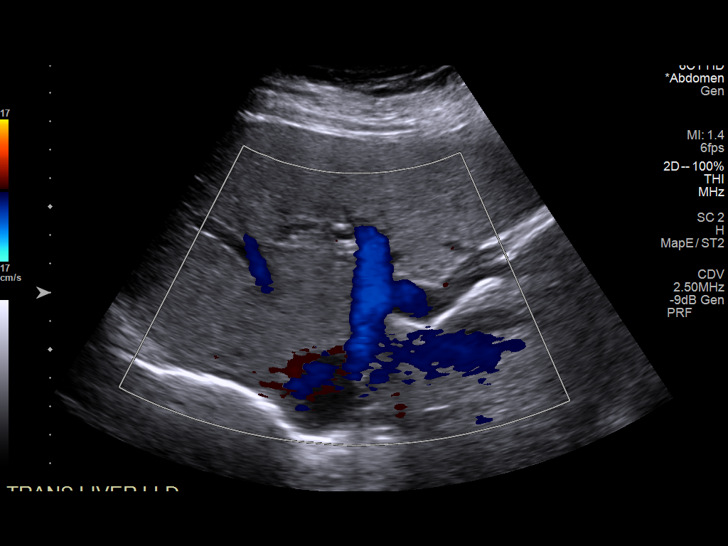

[14 of 25 positions shown; findings below may reference images not displayed]

FINDINGS: Gallbladder:

Normal wall thickness measuring 2 mm. No Murphy's sign or
pericholecystic fluid. Small subcentimeter gallstone noted measuring
9 mm appearing mobile.

Common bile duct:

Diameter: 5.5 mm

Liver:

heterogeneous, nodular and slightly increased echogenicity
compatible with cirrhosis. No large focal hepatic abnormality or
intrahepatic biliary dilatation. Portal vein is patent on color
Doppler imaging with normal direction of blood flow towards the
liver.

Other: No free fluid or ascites.
IMPRESSION: Stable mild cirrhotic changes.

No focal hepatic abnormality by ultrasound

Incidental cholelithiasis

## 2022-02-16 ENCOUNTER — Ambulatory Visit: Payer: Medicare Other | Admitting: Cardiology

## 2022-02-16 ENCOUNTER — Encounter: Payer: Self-pay | Admitting: Cardiology

## 2022-02-16 VITALS — BP 112/71 | HR 80 | Temp 97.3°F | Resp 16 | Ht 68.0 in | Wt 200.4 lb

## 2022-02-16 DIAGNOSIS — F17200 Nicotine dependence, unspecified, uncomplicated: Secondary | ICD-10-CM

## 2022-02-16 DIAGNOSIS — I4729 Other ventricular tachycardia: Secondary | ICD-10-CM | POA: Diagnosis not present

## 2022-02-16 DIAGNOSIS — E782 Mixed hyperlipidemia: Secondary | ICD-10-CM

## 2022-02-16 DIAGNOSIS — I493 Ventricular premature depolarization: Secondary | ICD-10-CM

## 2022-02-16 DIAGNOSIS — F17210 Nicotine dependence, cigarettes, uncomplicated: Secondary | ICD-10-CM | POA: Diagnosis not present

## 2022-02-16 DIAGNOSIS — R001 Bradycardia, unspecified: Secondary | ICD-10-CM | POA: Diagnosis not present

## 2022-02-16 DIAGNOSIS — I10 Essential (primary) hypertension: Secondary | ICD-10-CM

## 2022-02-16 MED ORDER — METOPROLOL SUCCINATE ER 25 MG PO TB24: 25.0000 mg | ORAL_TABLET | Freq: Every morning | ORAL | 0 refills | Status: DC

## 2022-02-16 NOTE — Progress Notes (Signed)
ID:  Hector Matthews, DOB [birth year], MRN [id number]  PCP:  Michel Harrow, PA-C  Cardiologist:  Rex Kras, DO, [facility] (established care 02/16/2020)  Date: 02/16/22 Last Office Visit: 08/11/2021  Chief Complaint  Patient presents with   asymptomatic bradycardia   Follow-up   HPI  Hector Matthews is a 71 y.o. male whose past medical history and cardiovascular risk factors include: Benign essential hypertension, tobacco use, hyperlipidemia, first-degree AV block, history of hepatitis C status posttreatment, cirrhosis, advanced age.  Initially referred to the practice for evaluation of bradycardia at the request of his hepatologist.  He underwent extended Holter monitor and noted an average heart rate of 63 bpm, PVC burden of less than 1%, and episodes of asymptomatic NSVT.  He is undergone additional cardiovascular work-up including an echo and stress test results noted below for further reference.  He presents today for 103-monthoffice visit.  He is doing well from a cardiovascular standpoint.  He denies anginal discomfort or heart failure symptoms.  He used to smoke 1.5 packs/day.  He is making conscious effort with regards to reducing his daily cigarette use.  Now he smokes anywhere from 0.5-1 pack/day.  FUNCTIONAL STATUS: No structured exercise program or daily routine.   ALLERGIES: No Known Allergies  MEDICATION LIST PRIOR TO VISIT: Current Meds  Medication Sig   atorvastatin (LIPITOR) 10 MG tablet TK 1 T PO QD   ibuprofen (ADVIL,MOTRIN) 200 MG tablet Take 200 mg by mouth every 6 (six) hours as needed.   lisinopril-hydrochlorothiazide (PRINZIDE,ZESTORETIC) 10-12.5 MG per tablet Take 1 tablet by mouth daily.     PAST MEDICAL HISTORY: Past Medical History:  Diagnosis Date   Allergy    Cirrhosis ([facility]    Hepatitis C    Hyperlipidemia    Hypertension    Nonsustained ventricular tachycardia ([facility])    Asymptomatic, noted on Holter monitor   Premature ventricular  contractions     PAST SURGICAL HISTORY: Past Surgical History:  Procedure Laterality Date   COLONOSCOPY     GANGLION CYST EXCISION Right    foot   POLYPECTOMY      FAMILY HISTORY: The patient family history includes Cancer in his father; Hypertension in his brother; Pancreatic cancer in his father.  SOCIAL HISTORY:  The patient  reports that he has been smoking cigarettes. He has a 22.00 pack-year smoking history. He has never used smokeless tobacco. He reports that he does not drink alcohol and does not use drugs.  REVIEW OF SYSTEMS: Review of Systems  Constitutional: Negative for chills and fever.  HENT:  Negative for hoarse voice and nosebleeds.   Eyes:  Negative for discharge, double vision and pain.  Cardiovascular:  Negative for chest pain, claudication, dyspnea on exertion, leg swelling, near-syncope, orthopnea, palpitations, paroxysmal nocturnal dyspnea and syncope.  Respiratory:  Positive for shortness of breath (chronic). Negative for hemoptysis.   Musculoskeletal:  Negative for muscle cramps and myalgias.  Gastrointestinal:  Negative for abdominal pain, constipation, diarrhea, hematemesis, hematochezia, melena, nausea and vomiting.  Neurological:  Negative for dizziness and light-headedness.    PHYSICAL EXAM:    02/16/2022   10:07 AM 08/11/2021    9:48 AM 03/30/2021   10:59 AM  Vitals with BMI  Height '5\' 8"'$  '5\' 8"'$  '5\' 8"'$   Weight 200 lbs 6 oz 202 lbs 13 oz 199 lbs  BMI 30.48 310.25385.27 Systolic [id number] Diastolic 71 78 86  Pulse 80 54 63    CONSTITUTIONAL: Age-appropriate, hemodynamically stable,  well-developed. No acute distress.  SKIN: Skin is warm and dry. No rash noted. No cyanosis. No pallor. No jaundice HEAD: Normocephalic and atraumatic.  EYES: No scleral icterus MOUTH/THROAT: Moist oral membranes.  NECK: No JVD present. No thyromegaly noted. No carotid bruits  CHEST Normal respiratory effort. No intercostal retractions  LUNGS: Poor air exchange,  decreased breath sounds at the bases bilaterally.  No stridor. No wheezes. No rales.  CARDIOVASCULAR: Regular, positive S1-S2, no murmurs rubs or gallops appreciated. ABDOMINAL: Soft, nontender, nondistended, positive bowel sounds in all 4 quadrants, no apparent ascites.  EXTREMITIES: No peripheral edema, palpable DP and PT pulses bilaterally HEMATOLOGIC: No significant bruising NEUROLOGIC: Oriented to person, place, and time. Nonfocal. Normal muscle tone.  PSYCHIATRIC: Normal mood and affect. Normal behavior. Cooperative  CARDIAC DATABASE: EKG: 02/16/2022: NSR, 77bpm, consider anteroseptal infarct, without underlying injury pattern.  Echocardiogram: 02/16/2021: Left ventricle cavity is normal in size and wall thickness. Normal global wall motion. Normal LV systolic function with EF 63%. Doppler evidence of grade I (impaired) diastolic dysfunction, normal LAP. Aneurysmal interatrial septum without 2D or color Doppler evidence of shunting. Moderate (Grade II) mitral regurgitation. Normal right atrial pressure.    Stress Testing: Lexiscan Tetrofosmin stress test 03/06/2021: Lexiscan nuclear stress test performed using 1-day protocol. Normal wall motion and thickening. Stress LVEF 50%. SPECT images show moderate sized area of decreased tracer uptake, both at rest and stress. In absence of wall motion abnormalities, this likely represents gut attenuation. Ischemic in this region cannot be excluded. Recommended clinical correlation. Low risk study.  Heart Catheterization: None  14 day extended Holter monitor: Dominant rhythm normal sinus. Burden of sinus bradycardia approximately 5% Heart rate 41-214 bpm.  Avg HR 63 bpm. No atrial fibrillation, high grade AV block, pauses (3 seconds or longer). Total ventricular ectopic burden <1%. Isolated ventricular ectopic beats 1351 with a longest ventricular trigeminy episode approximately 25 seconds. 3 episodes of ventricular tachycardia: The  fastest lasting 4 beats at a maximal rate of 214 bpm and the longest episode lasting 6 beats at an average of 136 bpm. Total supraventricular ectopic burden <1%.  The fastest episode of SVT was 5 beats in duration at 190 bpm. Patient triggered events: 0.  LABORATORY DATA:     No data to display             Latest Ref Rng & Units 02/15/2021   11:53 AM  CMP  Glucose 65 - 99 mg/dL 99   BUN 8 - 27 mg/dL 14   Creatinine 0.76 - 1.27 mg/dL 1.25   Sodium 134 - 144 mmol/L 137   Potassium 3.5 - 5.2 mmol/L 5.1   Chloride 96 - 106 mmol/L 100   CO2 20 - 29 mmol/L 25   Calcium 8.6 - 10.2 mg/dL 9.4   Total Protein 6.0 - 8.5 g/dL 7.0   Total Bilirubin 0.0 - 1.2 mg/dL 0.5   Alkaline Phos 44 - 121 IU/L 66   AST 0 - 40 IU/L 21   ALT 0 - 44 IU/L 14     Lipid Panel  No results found for: "CHOL", "TRIG", "HDL", "CHOLHDL", "VLDL", "LDLCALC", "LDLDIRECT", "LABVLDL"  No components found for: "NTPROBNP" No results for input(s): "PROBNP" in the last 8760 hours. No results for input(s): "TSH" in the last 8760 hours.  BMP No results for input(s): "NA", "K", "CL", "CO2", "GLUCOSE", "BUN", "CREATININE", "CALCIUM", "GFRNONAA", "GFRAA" in the last 8760 hours.   HEMOGLOBIN A1C No results found for: "HGBA1C", "MPG"  External Labs: 12/12/2020:  Hemoglobin 14.1 g/dL, hematocrit 41.1% Sodium 138, potassium 4.3, chloride 104, bicarb 29, BUN 14, creatinine 1.31 mg/dL. AST 18, ALT 11, alkaline phosphatase 52 Total cholesterol 106, triglycerides 57, HDL 35, LDL 58, non-HDL 71  02/10/2021: TSH 1.9  IMPRESSION:    ICD-10-CM   1. Bradycardia  R00.1 EKG 12-Lead    2. NSVT (nonsustained ventricular tachycardia) (HCC)  I47.29 metoprolol succinate (TOPROL XL) 25 MG 24 hr tablet    3. PVC's (premature ventricular contractions)  I49.3     4. Benign hypertension  I10     5. Mixed hyperlipidemia  E78.2     6. Smoking  F17.200        RECOMMENDATIONS: Hester Forget is a 71 y.o. male whose past medical  history and cardiac risk factors include: Benign essential hypertension, tobacco use, hyperlipidemia, first-degree AV block, history of hepatitis C status posttreatment, cirrhosis, advanced age.  Bradycardia Asymptomatic, currently normal sinus EKG shows sinus rhythm without underlying injury pattern. Echo: LVEF 18%, grade 1 diastolic dysfunction, normal left atrial pressure, moderate MR MPI: Low risk study. No identifiable reversible cause.  NSVT (nonsustained ventricular tachycardia) (HCC) / PVC's (premature ventricular contractions) Ischemic work-up low risk and LVEF is preserved per echo. Continue Toprol-XL 25 mg p.o. every morning. Medications refilled.  Benign hypertension Office blood pressures are well controlled. No medication changes warranted at this time. Currently managed by primary care provider.  Mixed hyperlipidemia Currently on atorvastatin.   He denies myalgia or other side effects. Currently managed by primary care provider.  Smoking Tobacco cessation counseling: Currently smoking 0.5-1 packs/day   Patient is willing to quit at this time.  Has reduced the amount of cigarette smoking since last office visit. 5 mins were spent counseling patient cessation techniques. We discussed various methods to help quit smoking, including deciding on a date to quit, joining a support group, pharmacological agents- nicotine gum/patch/lozenge.   Overall doing well from a cardiovascular standpoint since last office visit.  Has tolerated Toprol-XL well and medications refilled.  Recommended following up on as needed basis or annually.  Patient prefers to be followed annually given his age and comorbid conditions.  I have asked him to make a follow-up appointment after his yearly well visit so that we could go over his symptoms and labs from a cardiovascular standpoint.  FINAL MEDICATION LIST END OF ENCOUNTER: Meds ordered this encounter  Medications   metoprolol succinate (TOPROL  XL) 25 MG 24 hr tablet    Sig: Take 1 tablet (25 mg total) by mouth every morning.    Dispense:  180 tablet    Refill:  0     Medications Discontinued During This Encounter  Medication Reason   metoprolol succinate (TOPROL XL) 25 MG 24 hr tablet Reorder      Current Outpatient Medications:    atorvastatin (LIPITOR) 10 MG tablet, TK 1 T PO QD, Disp: , Rfl:    ibuprofen (ADVIL,MOTRIN) 200 MG tablet, Take 200 mg by mouth every 6 (six) hours as needed., Disp: , Rfl:    lisinopril-hydrochlorothiazide (PRINZIDE,ZESTORETIC) 10-12.5 MG per tablet, Take 1 tablet by mouth daily., Disp: , Rfl:    metoprolol succinate (TOPROL XL) 25 MG 24 hr tablet, Take 1 tablet (25 mg total) by mouth every morning., Disp: 180 tablet, Rfl: 0  Orders Placed This Encounter  Procedures   EKG 12-Lead   There are no Patient Instructions on file for this visit.   --Continue cardiac medications as reconciled in final medication list. --Return for Annual  follow-up for bradycardia.. Or sooner if needed. --Continue follow-up with your primary care physician regarding the management of your other chronic comorbid conditions.  Patient's questions and concerns were addressed to his satisfaction. He voices understanding of the instructions provided during this encounter.   This note was created using a voice recognition software as a result there may be grammatical errors inadvertently enclosed that do not reflect the nature of this encounter. Every attempt is made to correct such errors.  Rex Kras, [place], [facility]  Pager: [phone] Office: [phone]

## 2022-02-22 ENCOUNTER — Ambulatory Visit (INDEPENDENT_AMBULATORY_CARE_PROVIDER_SITE_OTHER): Payer: Medicare Other | Admitting: Family Medicine

## 2022-02-22 ENCOUNTER — Encounter: Payer: Self-pay | Admitting: Family Medicine

## 2022-02-22 VITALS — BP 126/82 | HR 80 | Temp 97.8°F | Ht 66.5 in | Wt 195.6 lb

## 2022-02-22 DIAGNOSIS — R6883 Chills (without fever): Secondary | ICD-10-CM | POA: Insufficient documentation

## 2022-02-22 DIAGNOSIS — I440 Atrioventricular block, first degree: Secondary | ICD-10-CM | POA: Diagnosis not present

## 2022-02-22 DIAGNOSIS — K7460 Unspecified cirrhosis of liver: Secondary | ICD-10-CM | POA: Diagnosis not present

## 2022-02-22 DIAGNOSIS — R001 Bradycardia, unspecified: Secondary | ICD-10-CM

## 2022-02-22 DIAGNOSIS — I10 Essential (primary) hypertension: Secondary | ICD-10-CM

## 2022-02-22 DIAGNOSIS — Z720 Tobacco use: Secondary | ICD-10-CM | POA: Diagnosis not present

## 2022-02-22 DIAGNOSIS — E785 Hyperlipidemia, unspecified: Secondary | ICD-10-CM

## 2022-02-22 NOTE — Assessment & Plan Note (Addendum)
Afebrile today Do not see any other recent labs in chart Broad differential including infectious illness, underlying liver or renal disease, autoimmune, or malignancy Recent CMP normal, making renal or liver disease less likely No other associated joint pain or rashes, does lower differential for infectious disease Does have a history of heavy smoking, cannot rule out lung malignancy Patient follow-up in 2 weeks for further work-up, encouraged to check temperature and track at home We will need to do broad labs including UA, CBC, LDH, TSH, routine cancer screenings including PSA, colonoscopy, and CT lung and other labs as needed

## 2022-02-22 NOTE — Assessment & Plan Note (Signed)
Normotensive Stable Continue current regimen 

## 2022-02-22 NOTE — Patient Instructions (Signed)
Continue ibuprofen as need for chills  If get chest pain or Shortness of breath, go to ED

## 2022-02-22 NOTE — Assessment & Plan Note (Signed)
Recommend cessation, patient wanting to quit and actively trying to quit Follow-up in 2 weeks to discuss quitting and surveillance

## 2022-02-22 NOTE — Assessment & Plan Note (Signed)
Recently followed with cardiology Associate with NSVT They recommended taking Toprol

## 2022-02-22 NOTE — Assessment & Plan Note (Signed)
Currently taking Lipitor 10 mg Repeat lipid at follow-up

## 2022-02-22 NOTE — Assessment & Plan Note (Signed)
Recent CMP with normal LFTs Patient follows with hepatology

## 2022-02-22 NOTE — Progress Notes (Signed)
Assessment/Plan:  At today's visit, we discussed treatment options, associated risk and benefits, and engage in counseling as needed.  Additionally the following were reviewed: Past medical records, past medical and surgical history, family and social background, as well as relevant laboratory results, imaging findings, and specialty notes, where applicable.  This message was generated using dictation software, and as a result, it may contain unintentional typos or errors.  Nevertheless, extensive effort was made to accurately convey at the pertinent aspects of the patient visit.    There may have been are other unrelated non-urgent complaints, but due to the busy schedule and the amount of time already spent with him, time does not permit to address these issues at today's visit. Another appointment may have or has been requested to review these additional issues.  Problem List Items Addressed This Visit       Cardiovascular and Mediastinum   Primary hypertension - Primary    Normotensive Stable Continue current regimen      First degree heart block     Digestive   Hepatic cirrhosis (HCC)    Recent CMP with normal LFTs Patient follows with hepatology        Other   Bradycardia    Recently followed with cardiology Associate with NSVT They recommended taking Toprol      Hyperlipidemia, unspecified    Currently taking Lipitor 10 mg Repeat lipid at follow-up      Tobacco use    Recommend cessation, patient wanting to quit and actively trying to quit Follow-up in 2 weeks to discuss quitting and surveillance      Chills    Afebrile today Do not see any other recent labs in chart Broad differential including infectious illness, underlying liver or renal disease, autoimmune, or malignancy Recent CMP normal, making renal or liver disease less likely No other associated joint pain or rashes, does lower differential for infectious disease Does have a history of heavy  smoking, cannot rule out lung malignancy Patient follow-up in 2 weeks for further work-up, encouraged to check temperature and track at home We will need to do broad labs including UA, CBC, LDH, TSH, routine cancer screenings including PSA, colonoscopy, and CT lung and other labs as needed           Subjective:  HPI:  Kayshaun Polanco is a 71 y.o. male who has Primary hypertension; Bradycardia; First degree heart block; Hepatic cirrhosis ([place]); Hyperlipidemia, unspecified; Tobacco use; and Chills on their problem list..   He  has a past medical history of Allergy, Cirrhosis ([place]), Hepatitis C, Hyperlipidemia, Hypertension, Nonsustained ventricular tachycardia ([place]), and Premature ventricular contractions.Marland Kitchen   He presents with chief complaint of Establish Care (Patient has having hot flashes, chills, fatigue, body aches x 2 months) .    Patient is here with wife.  She also informs part of the history. Fever: Patient presents with fever which has been low-grade, has not taken at home,  for about 2 months.  Other associated symptoms include:  chills, tired, sweats . Symptoms which are not present include: apneic episodes, conjunctivitis, cough, cyanotic episodes, diarrhea, hoarse cry, jaundice, joint swelling, lethargy, nasal congestion, neck stiffness, poor feeding, rash, respiratory difficulty, rhinorrhea, strong odor to urine, vomiting, and wheezing. Home treatment has included OTC antipyretics with satisfactory improvement. Denies exposure to anyone ill. Reports some urinary urgency, but no dysuria, hematuria.  He also denies rash.   Patient has a history o  Cirrhosis from hep c s/p treatment. [facility].  He follows with GI.  CMP 02/2022 had normal LFTs.  Patient does report that he can be sleepy in the day.  Does not take naps.  He does snore, patent patient reports mild.  He has not had an apneic episode per partner.  He has no chest pain, or shortness of breath.   Patient with a history of  NSVT and bradycardia and some mild AV block.  He was recently evaluated by [facility], most recently at 02/2022.  They recommend continue metoprolol.  Patient has history of hypertension.  This is controlled on metoprolol, lisinopril/HCTZ.  Patient is a heavy smoker.he does endorse some intermittant wheezing, but none today.  Did smoke up to 3 packs a day for years, recently cut back.    Past Surgical History:  Procedure Laterality Date   COLONOSCOPY     GANGLION CYST EXCISION Right    foot   POLYPECTOMY      Outpatient Medications Prior to Visit  Medication Sig Dispense Refill   atorvastatin (LIPITOR) 10 MG tablet TK 1 T PO QD     ibuprofen (ADVIL,MOTRIN) 200 MG tablet Take 200 mg by mouth every 6 (six) hours as needed.     lisinopril-hydrochlorothiazide (PRINZIDE,ZESTORETIC) 10-12.5 MG per tablet Take 1 tablet by mouth daily.     metoprolol succinate (TOPROL XL) 25 MG 24 hr tablet Take 1 tablet (25 mg total) by mouth every morning. 180 tablet 0   No facility-administered medications prior to visit.    Family History  Problem Relation Age of Onset   Pancreatic cancer Father    Cancer Father    Hypertension Brother        x 2   Colon cancer Neg Hx    Colon polyps Neg Hx    Esophageal cancer Neg Hx    Rectal cancer Neg Hx    Stomach cancer Neg Hx     Social History   Socioeconomic History   Marital status: Married    Spouse name: Not on file   Number of children: 0   Years of education: Not on file   Highest education level: Not on file  Occupational History   Occupation: truck Adult nurse  Tobacco Use   Smoking status: Every Day    Packs/day: 3.00    Years: 22.00    Total pack years: 66.00    Types: Cigarettes   Smokeless tobacco: Never  Vaping Use   Vaping Use: Never used  Substance and Sexual Activity   Alcohol use: No   Drug use: No   Sexual activity: Not on file  Other Topics Concern   Not on file  Social History Narrative   Not  on file   Social Determinants of Health   Financial Resource Strain: Not on file  Food Insecurity: Not on file  Transportation Needs: Not on file  Physical Activity: Not on file  Stress: Not on file  Social Connections: Not on file  Intimate Partner Violence: Not on file  Objective:  Physical Exam: BP 126/82 (BP Location: Left Arm, Patient Position: Sitting, Cuff Size: Large)   Pulse 80   Temp 97.8 F (36.6 C) (Temporal)   Ht 5' 6.5" (1.689 m)   Wt 195 lb 9.6 oz (88.7 kg)   SpO2 96%   BMI 31.10 kg/m    General: No acute distress. Awake and conversant.  Eyes: Normal conjunctiva, anicteric. Round symmetric pupils.  ENT: Hearing grossly intact. No nasal discharge.  Neck: Neck is supple. No masses or thyromegaly.  Respiratory: Respirations are non-labored. No auditory wheezing.  CTA B Skin: Warm. No rashes or ulcers.  Psych: Alert and oriented. Cooperative, Appropriate mood and affect, Normal judgment.  CV: No cyanosis or JVD, normal S1-S2, no murmurs rubs or gallops, MSK: Normal ambulation. No clubbing, no lower extremity edema Neuro: Sensation and CN II-XII grossly normal.        Alesia Banda, MD, MS

## 2022-03-09 ENCOUNTER — Ambulatory Visit (INDEPENDENT_AMBULATORY_CARE_PROVIDER_SITE_OTHER): Payer: Medicare Other | Admitting: Family Medicine

## 2022-03-09 ENCOUNTER — Encounter: Payer: Self-pay | Admitting: Family Medicine

## 2022-03-09 VITALS — BP 128/82 | HR 75 | Temp 97.6°F | Wt 191.4 lb

## 2022-03-09 DIAGNOSIS — R6883 Chills (without fever): Secondary | ICD-10-CM | POA: Diagnosis not present

## 2022-03-09 DIAGNOSIS — F17210 Nicotine dependence, cigarettes, uncomplicated: Secondary | ICD-10-CM | POA: Diagnosis not present

## 2022-03-09 DIAGNOSIS — F17200 Nicotine dependence, unspecified, uncomplicated: Secondary | ICD-10-CM | POA: Insufficient documentation

## 2022-03-09 MED ORDER — CHANTIX STARTING MONTH PAK 0.5 MG X 11 & 1 MG X 42 PO TBPK
ORAL_TABLET | ORAL | 0 refills | Status: DC
Start: 2022-03-09 — End: 2022-06-15

## 2022-03-09 MED ORDER — CHANTIX STARTING MONTH PAK 0.5 MG X 11 & 1 MG X 42 PO TBPK
ORAL_TABLET | ORAL | 0 refills | Status: DC
Start: 2022-03-09 — End: 2022-03-09

## 2022-03-09 NOTE — Assessment & Plan Note (Signed)
Counseled on dangers smoking Patient wants to quit, currently patient is on cutting back, encouraged continued cessation Chantix Follow-up in 3 months

## 2022-03-09 NOTE — Assessment & Plan Note (Signed)
Improved Likely resolving viral infection Advised patient follow-up if worsening

## 2022-03-09 NOTE — Patient Instructions (Signed)
1-800-QUIT-NOW is a toll-free number operated by the [employer]) that will connect you directly to your state's tobacco quitline. The number serves as a national portal to link callers to their state quitline based on their area code.

## 2022-03-09 NOTE — Progress Notes (Signed)
Assessment/Plan:   Problem List Items Addressed This Visit       Other   Chills (without fever)    Improved Likely resolving viral infection Advised patient follow-up if worsening      Smoking - Primary    Counseled on dangers smoking Patient wants to quit, currently patient is on cutting back, encouraged continued cessation Chantix Follow-up in 3 months       Relevant Medications   Varenicline Tartrate, Starter, (CHANTIX STARTING MONTH PAK) 0.5 MG X 11 & 1 MG X 42 TBPK   Other Relevant Orders   Ambulatory referral to Smoking Cessation Program       Subjective:  HPI:  Hector Matthews is a 71 y.o. male who has Primary hypertension; Bradycardia; First degree heart block; Hepatic cirrhosis ([place]); Hyperlipidemia, unspecified; Tobacco use; Chills (without fever); and Smoking on their problem list..   He  has a past medical history of Allergy, Cirrhosis ([place]), Hepatitis C, Hyperlipidemia, Hypertension, Nonsustained ventricular tachycardia ([place]), and Premature ventricular contractions.Marland Kitchen   He presents with chief complaint of Follow-up (2 week follow up on chills and smoking. Chills have gotten better. Currently smoking a pack or less a day. Patient also having eye pain and would like to discuss stopping ibuprofen due to liver. ) .   Patient here for follow-up chills.  He reports that they have improved.  He is taking at present during, however denies any fevers at home.  No chest pain or shortness of breath  Patient's 10 point smoking.  Smokes less than a pack a day, but} reports that he has quit in the past, but it was difficult.  He has had things on medicine but is concerned about possible cost.  Past Surgical History:  Procedure Laterality Date   COLONOSCOPY     GANGLION CYST EXCISION Right    foot   POLYPECTOMY      Outpatient Medications Prior to Visit  Medication Sig Dispense Refill   atorvastatin (LIPITOR) 10 MG tablet TK 1 T PO QD     ibuprofen (ADVIL,MOTRIN)  200 MG tablet Take 200 mg by mouth every 6 (six) hours as needed.     lisinopril-hydrochlorothiazide (PRINZIDE,ZESTORETIC) 10-12.5 MG per tablet Take 1 tablet by mouth daily.     metoprolol succinate (TOPROL XL) 25 MG 24 hr tablet Take 1 tablet (25 mg total) by mouth every morning. 180 tablet 0   No facility-administered medications prior to visit.    Family History  Problem Relation Age of Onset   Pancreatic cancer Father    Cancer Father    Hypertension Brother        x 2   Colon cancer Neg Hx    Colon polyps Neg Hx    Esophageal cancer Neg Hx    Rectal cancer Neg Hx    Stomach cancer Neg Hx     Social History   Socioeconomic History   Marital status: Married    Spouse name: Not on file   Number of children: 0   Years of education: Not on file   Highest education level: Not on file  Occupational History   Occupation: truck Adult nurse  Tobacco Use   Smoking status: Every Day    Packs/day: 1.00    Years: 22.00    Total pack years: 22.00    Types: Cigarettes   Smokeless tobacco: Never  Vaping Use   Vaping Use: Never used  Substance and Sexual Activity   Alcohol use: No   Drug  use: No   Sexual activity: Not on file  Other Topics Concern   Not on file  Social History Narrative   Not on file   Social Determinants of Health   Financial Resource Strain: Not on file  Food Insecurity: Not on file  Transportation Needs: Not on file  Physical Activity: Not on file  Stress: Not on file  Social Connections: Not on file  Intimate Partner Violence: Not on file                                                                                                 Objective:  Physical Exam: BP 128/82 (BP Location: Left Arm, Patient Position: Sitting, Cuff Size: Large)   Pulse 75   Temp 97.6 F (36.4 C) (Temporal)   Wt 191 lb 6.4 oz (86.8 kg)   SpO2 97%   BMI 30.43 kg/m    General: No acute distress. Awake and conversant.  Eyes: Normal conjunctiva,  anicteric. Round symmetric pupils.  ENT: Hearing grossly intact. No nasal discharge.  Neck: Neck is supple. No masses or thyromegaly.  Respiratory: Respirations are non-labored. No auditory wheezing.  Skin: Warm. No rashes or ulcers.  Psych: Alert and oriented. Cooperative, Appropriate mood and affect, Normal judgment.  CV: No cyanosis or JVD MSK: Normal ambulation. No clubbing  Neuro: Sensation and CN II-XII grossly normal.        Alesia Banda, MD, MS

## 2022-03-20 ENCOUNTER — Ambulatory Visit (INDEPENDENT_AMBULATORY_CARE_PROVIDER_SITE_OTHER): Payer: Medicare Other

## 2022-03-20 DIAGNOSIS — Z0000 Encounter for general adult medical examination without abnormal findings: Secondary | ICD-10-CM | POA: Diagnosis not present

## 2022-03-20 NOTE — Patient Instructions (Signed)
Hector Matthews , Thank you for taking time to come for your Medicare Wellness Visit. I appreciate your ongoing commitment to your health goals. Please review the following plan we discussed and let me know if I can assist you in the future.   Screening recommendations/referrals: Colonoscopy: 03/19/2019 Recommended yearly ophthalmology/optometry visit for glaucoma screening and checkup Recommended yearly dental visit for hygiene and checkup  Vaccinations: Influenza vaccine: completed  Pneumococcal vaccine: completed  Tdap vaccine: 04/19/2014 Shingles vaccine: will consider     Advanced directives: none   Conditions/risks identified: none   Next appointment: none   Preventive Care 69 Years and Older, Male Preventive care refers to lifestyle choices and visits with your health care provider that can promote health and wellness. What does preventive care include? A yearly physical exam. This is also called an annual well check. Dental exams once or twice a year. Routine eye exams. Ask your health care provider how often you should have your eyes checked. Personal lifestyle choices, including: Daily care of your teeth and gums. Regular physical activity. Eating a healthy diet. Avoiding tobacco and drug use. Limiting alcohol use. Practicing safe sex. Taking low doses of aspirin every day. Taking vitamin and mineral supplements as recommended by your health care provider. What happens during an annual well check? The services and screenings done by your health care provider during your annual well check will depend on your age, overall health, lifestyle risk factors, and family history of disease. Counseling  Your health care provider may ask you questions about your: Alcohol use. Tobacco use. Drug use. Emotional well-being. Home and relationship well-being. Sexual activity. Eating habits. History of falls. Memory and ability to understand (cognition). Work and work  Statistician. Screening  You may have the following tests or measurements: Height, weight, and BMI. Blood pressure. Lipid and cholesterol levels. These may be checked every 5 years, or more frequently if you are over 41 years old. Skin check. Lung cancer screening. You may have this screening every year starting at age 64 if you have a 30-pack-year history of smoking and currently smoke or have quit within the past 15 years. Fecal occult blood test (FOBT) of the stool. You may have this test every year starting at age 43. Flexible sigmoidoscopy or colonoscopy. You may have a sigmoidoscopy every 5 years or a colonoscopy every 10 years starting at age 63. Prostate cancer screening. Recommendations will vary depending on your family history and other risks. Hepatitis C blood test. Hepatitis B blood test. Sexually transmitted disease (STD) testing. Diabetes screening. This is done by checking your blood sugar (glucose) after you have not eaten for a while (fasting). You may have this done every 1-3 years. Abdominal aortic aneurysm (AAA) screening. You may need this if you are a current or former smoker. Osteoporosis. You may be screened starting at age 57 if you are at high risk. Talk with your health care provider about your test results, treatment options, and if necessary, the need for more tests. Vaccines  Your health care provider may recommend certain vaccines, such as: Influenza vaccine. This is recommended every year. Tetanus, diphtheria, and acellular pertussis (Tdap, Td) vaccine. You may need a Td booster every 10 years. Zoster vaccine. You may need this after age 49. Pneumococcal 13-valent conjugate (PCV13) vaccine. One dose is recommended after age 79. Pneumococcal polysaccharide (PPSV23) vaccine. One dose is recommended after age 81. Talk to your health care provider about which screenings and vaccines you need and how often you  need them. This information is not intended to replace  advice given to you by your health care provider. Make sure you discuss any questions you have with your health care provider. Document Released: 08/19/2015 Document Revised: 04/11/2016 Document Reviewed: 05/24/2015 Elsevier Interactive Patient Education  2017 [place] Prevention in the Home Falls can cause injuries. They can happen to people of all ages. There are many things you can do to make your home safe and to help prevent falls. What can I do on the outside of my home? Regularly fix the edges of walkways and driveways and fix any cracks. Remove anything that might make you trip as you walk through a door, such as a raised step or threshold. Trim any bushes or trees on the path to your home. Use bright outdoor lighting. Clear any walking paths of anything that might make someone trip, such as rocks or tools. Regularly check to see if handrails are loose or broken. Make sure that both sides of any steps have handrails. Any raised decks and porches should have guardrails on the edges. Have any leaves, snow, or ice cleared regularly. Use sand or salt on walking paths during winter. Clean up any spills in your garage right away. This includes oil or grease spills. What can I do in the bathroom? Use night lights. Install grab bars by the toilet and in the tub and shower. Do not use towel bars as grab bars. Use non-skid mats or decals in the tub or shower. If you need to sit down in the shower, use a plastic, non-slip stool. Keep the floor dry. Clean up any water that spills on the floor as soon as it happens. Remove soap buildup in the tub or shower regularly. Attach bath mats securely with double-sided non-slip rug tape. Do not have throw rugs and other things on the floor that can make you trip. What can I do in the bedroom? Use night lights. Make sure that you have a light by your bed that is easy to reach. Do not use any sheets or blankets that are too big for your bed.  They should not hang down onto the floor. Have a firm chair that has side arms. You can use this for support while you get dressed. Do not have throw rugs and other things on the floor that can make you trip. What can I do in the kitchen? Clean up any spills right away. Avoid walking on wet floors. Keep items that you use a lot in easy-to-reach places. If you need to reach something above you, use a strong step stool that has a grab bar. Keep electrical cords out of the way. Do not use floor polish or wax that makes floors slippery. If you must use wax, use non-skid floor wax. Do not have throw rugs and other things on the floor that can make you trip. What can I do with my stairs? Do not leave any items on the stairs. Make sure that there are handrails on both sides of the stairs and use them. Fix handrails that are broken or loose. Make sure that handrails are as long as the stairways. Check any carpeting to make sure that it is firmly attached to the stairs. Fix any carpet that is loose or worn. Avoid having throw rugs at the top or bottom of the stairs. If you do have throw rugs, attach them to the floor with carpet tape. Make sure that you have a light switch  at the top of the stairs and the bottom of the stairs. If you do not have them, ask someone to add them for you. What else can I do to help prevent falls? Wear shoes that: Do not have high heels. Have rubber bottoms. Are comfortable and fit you well. Are closed at the toe. Do not wear sandals. If you use a stepladder: Make sure that it is fully opened. Do not climb a closed stepladder. Make sure that both sides of the stepladder are locked into place. Ask someone to hold it for you, if possible. Clearly mark and make sure that you can see: Any grab bars or handrails. First and last steps. Where the edge of each step is. Use tools that help you move around (mobility aids) if they are needed. These  include: Canes. Walkers. Scooters. Crutches. Turn on the lights when you go into a dark area. Replace any light bulbs as soon as they burn out. Set up your furniture so you have a clear path. Avoid moving your furniture around. If any of your floors are uneven, fix them. If there are any pets around you, be aware of where they are. Review your medicines with your doctor. Some medicines can make you feel dizzy. This can increase your chance of falling. Ask your doctor what other things that you can do to help prevent falls. This information is not intended to replace advice given to you by your health care provider. Make sure you discuss any questions you have with your health care provider. Document Released: 05/19/2009 Document Revised: 12/29/2015 Document Reviewed: 08/27/2014 Elsevier Interactive Patient Education  2017 [employer].

## 2022-03-20 NOTE — Progress Notes (Signed)
Subjective:   Hector Matthews is a 71 y.o. male who presents for an Subsequent  Medicare Annual Wellness Visit.   I connected with [name]  today by telephone and verified that I am speaking with the correct person using two identifiers. Location patient: home Location provider: work Persons participating in the virtual visit: patient, provider.   I discussed the limitations, risks, security and privacy concerns of performing an evaluation and management service by telephone and the availability of in person appointments. I also discussed with the patient that there may be a patient responsible charge related to this service. The patient expressed understanding and verbally consented to this telephonic visit.    Interactive audio and video telecommunications were attempted between this provider and patient, however failed, due to patient having technical difficulties OR patient did not have access to video capability.  We continued and completed visit with audio only.    Review of Systems     Cardiac Risk Factors include: advanced age (>62mn, >>31women);male gender     Objective:    Today's Vitals   There is no height or weight on file to calculate BMI.     03/20/2022    8:55 AM  Advanced Directives  Does Patient Have a Medical Advance Directive? No;Yes  Type of AParamedicof [place]artin [place] will  Copy of [place] Chart? No - copy requested    Current Medications (verified) Outpatient Encounter Medications as of 03/20/2022  Medication Sig   atorvastatin (LIPITOR) 10 MG tablet TK 1 T PO QD   ibuprofen (ADVIL,MOTRIN) 200 MG tablet Take 200 mg by mouth every 6 (six) hours as needed.   lisinopril-hydrochlorothiazide (PRINZIDE,ZESTORETIC) 10-12.5 MG per tablet Take 1 tablet by mouth daily.   metoprolol succinate (TOPROL XL) 25 MG 24 hr tablet Take 1 tablet (25 mg total) by mouth every morning.   Varenicline Tartrate, Starter,  (CHANTIX STARTING MONTH PAK) 0.5 MG X 11 & 1 MG X 42 TBPK Initial, 0.5 mg orally once daily for 3 days, then 0.5 mg twice daily on days 4 through 7, and then 1 mg twice daily thereafter for 12 weeks of treatment; 12 additional weeks of therapy may increase likelihood of long-term abstinence in patients who successfully stop smoking (Patient not taking: Reported on 03/20/2022)   No facility-administered encounter medications on file as of 03/20/2022.    Allergies (verified) Patient has no known allergies.   History: Past Medical History:  Diagnosis Date   Allergy    [place]    Hepatitis C    Hyperlipidemia    Hypertension    Nonsustained ventricular tachycardia (HCC)    Asymptomatic, noted on Holter monitor   Premature ventricular contractions    Past Surgical History:  Procedure Laterality Date   COLONOSCOPY     GANGLION CYST EXCISION Right    foot   POLYPECTOMY     Family History  Problem Relation Age of Onset   Pancreatic cancer Father    Cancer Father    Hypertension Brother        x 2   Colon cancer Neg Hx    Colon polyps Neg Hx    Esophageal cancer Neg Hx    Rectal cancer Neg Hx    Stomach cancer Neg Hx    Social History   Socioeconomic History   Marital status: Married    Spouse name: Not on file   Number of children: 0   Years of education:  Not on file   Highest education level: Not on file  Occupational History   Occupation: truck Adult nurse  Tobacco Use   Smoking status: Every Day    Packs/day: 1.00    Years: 22.00    Total pack years: 22.00    Types: Cigarettes   Smokeless tobacco: Never  Vaping Use   Vaping Use: Never used  Substance and Sexual Activity   Alcohol use: No   Drug use: No   Sexual activity: Not on file  Other Topics Concern   Not on file  Social History Narrative   Not on file   Social Determinants of Health   Financial Resource Strain: Low Risk  (03/20/2022)   Overall Financial Resource Strain (CARDIA)     Difficulty of Paying Living Expenses: Not hard at all  Food Insecurity: No Food Insecurity (03/20/2022)   Hunger Vital Sign    Worried About Running Out of Food in the Last Year: Never true    Ran Out of Food in the Last Year: Never true  Transportation Needs: No Transportation Needs (03/20/2022)   PRAPARE - Hydrologist (Medical): No    Lack of Transportation (Non-Medical): No  Physical Activity: Inactive (03/20/2022)   Exercise Vital Sign    Days of Exercise per Week: 0 days    Minutes of Exercise per Session: 0 min  Stress: No Stress Concern Present (03/20/2022)   [place]    Feeling of Stress : Not at all  Social Connections: Moderately Isolated (03/20/2022)   Social Connection and Isolation Panel [NHANES]    Frequency of Communication with Friends and Family: Twice a week    Frequency of Social Gatherings with Friends and Family: Twice a week    Attends Religious Services: Never    Printmaker: No    Attends Music therapist: Never    Marital Status: Married    Tobacco Counseling Ready to quit: Not Answered Counseling given: Not Answered   Clinical Intake:  Pre-visit preparation completed: Yes  Pain : No/denies pain     Nutritional Risks: None Diabetes: No  How often do you need to have someone help you when you read instructions, pamphlets, or other written materials from your doctor or pharmacy?: 2 - Rarely What is the last grade level you completed in school?: 8th grade  Diabetic?no     Information entered by :: L.Wilson,LPN   Activities of Daily Living    03/20/2022    8:57 AM  In your present state of health, do you have any difficulty performing the following activities:  Hearing? 0  Vision? 0  Difficulty concentrating or making decisions? 0  Walking or climbing stairs? 0  Dressing or bathing? 0  Doing errands,  shopping? 0  Preparing Food and eating ? N  Using the Toilet? N  In the past six months, have you accidently leaked urine? N  Do you have problems with loss of bowel control? N  Managing your Medications? N  Managing your Finances? N  Housekeeping or managing your Housekeeping? N    Patient Care Team: Bonnita Hollow, MD as PCP - General (Family Medicine)  Indicate any recent Medical Services you may have received from other than [facility] providers in the past year (date may be approximate).     Assessment:   This is a routine wellness examination for Hector Matthews.  Hearing/Vision screen Vision Screening -  Comments:: Annual eye exams wear glasses   Dietary issues and exercise activities discussed: Current Exercise Habits: The patient does not participate in regular exercise at present, Exercise limited by: None identified   Goals Addressed   None    Depression Screen    03/20/2022    8:56 AM 03/20/2022    8:53 AM 02/22/2022    3:50 PM  PHQ 2/9 Scores  PHQ - 2 Score 0 0 0  PHQ- 9 Score   2    Fall Risk    03/20/2022    8:56 AM  Fall Risk   Falls in the past year? 0  Number falls in past yr: 0  Injury with Fall? 0  Follow up Falls evaluation completed;Education provided    FALL RISK PREVENTION PERTAINING TO THE HOME:  Any stairs in or around the home? Yes  If so, are there any without handrails? No  Home free of loose throw rugs in walkways, pet beds, electrical cords, etc? Yes  Adequate lighting in your home to reduce risk of falls? Yes   ASSISTIVE DEVICES UTILIZED TO PREVENT FALLS:  Life alert? No  Use of a cane, walker or w/c? No  Grab bars in the bathroom? Yes  Shower chair or bench in shower? Yes  Elevated toilet seat or a handicapped toilet? Yes     Cognitive Function:  Normal cognitive status assessed by telephone conversation by this Nurse Health Advisor. No abnormalities found.        03/20/2022    8:59 AM  6CIT Screen  What Year? 0 points  What  month? 0 points  What time? 0 points  Count back from 20 0 points  Months in reverse 0 points  Repeat phrase 0 points  Total Score 0 points    Immunizations Immunization History  Administered Date(s) Administered   DTaP 11/24/1957, 01/14/1958, 03/10/1958, 06/13/1959   Influenza Split 04/19/2014   Influenza,inj,Quad PF,6+ Mos 05/28/2016   OPV 02/27/1955, 03/13/1955, 02/28/1956, 03/10/1958   Pneumococcal Conjugate-13 09/03/2019   Tdap 04/19/2014    TDAP status: Up to date  Flu Vaccine status: Up to date  Pneumococcal vaccine status: Up to date  Covid-19 vaccine status: Completed vaccines  Qualifies for Shingles Vaccine? Yes   Zostavax completed No   Shingrix Completed?: No.    Education has been provided regarding the importance of this vaccine. Patient has been advised to call insurance company to determine out of pocket expense if they have not yet received this vaccine. Advised may also receive vaccine at local pharmacy or Health Dept. Verbalized acceptance and understanding.  Screening Tests Health Maintenance  Topic Date Due   COVID-19 Vaccine (1) Never done   Zoster Vaccines- Shingrix (1 of 2) Never done   Pneumonia Vaccine 74+ Years old (2 - PPSV23 or PCV20) 10/29/2019   INFLUENZA VACCINE  03/06/2022   COLONOSCOPY (Pts 45-67yr Insurance coverage will need to be confirmed)  03/18/2024   TETANUS/TDAP  04/19/2024   Hepatitis C Screening  Completed   HPV VACCINES  Aged Out    Health Maintenance  Health Maintenance Due  Topic Date Due   COVID-19 Vaccine (1) Never done   Zoster Vaccines- Shingrix (1 of 2) Never done   Pneumonia Vaccine 69 Years old (2 - PPSV23 or PCV20) 10/29/2019   INFLUENZA VACCINE  03/06/2022    Colorectal cancer screening: Type of screening: Colonoscopy. Completed 03/19/2019. Repeat every 5 years  Lung Cancer Screening: (Low Dose CT Chest recommended if Age 69-80years, 31  pack-year currently smoking OR have quit w/in 15years.) does  qualify.   Lung Cancer Screening Referral: declined   Additional Screening:  Hepatitis C Screening: does not qualify;   Vision Screening: Recommended annual ophthalmology exams for early detection of glaucoma and other disorders of the eye. Is the patient up to date with their annual eye exam?  Yes  Who is the provider or what is the name of the office in which the patient attends annual eye exams? [facility]  If pt is not established with a provider, would they like to be referred to a provider to establish care? No .   Dental Screening: Recommended annual dental exams for proper oral hygiene  Community Resource Referral / Chronic Care Management: CRR required this visit?  No   CCM required this visit?  No      Plan:     I have personally reviewed and noted the following in the patient's chart:   Medical and social history Use of alcohol, tobacco or illicit drugs  Current medications and supplements including opioid prescriptions. Patient is not currently taking opioid prescriptions. Functional ability and status Nutritional status Physical activity Advanced directives List of other physicians Hospitalizations, surgeries, and ER visits in previous 12 months Vitals Screenings to include cognitive, depression, and falls Referrals and appointments  In addition, I have reviewed and discussed with patient certain preventive protocols, quality metrics, and best practice recommendations. A written personalized care plan for preventive services as well as general preventive health recommendations were provided to patient.     Daphane Shepherd, LPN   [id number]   Nurse Notes: none

## 2022-04-13 ENCOUNTER — Other Ambulatory Visit: Payer: Self-pay | Admitting: Nurse Practitioner

## 2022-04-13 DIAGNOSIS — K7469 Other cirrhosis of liver: Secondary | ICD-10-CM

## 2022-04-13 DIAGNOSIS — K760 Fatty (change of) liver, not elsewhere classified: Secondary | ICD-10-CM | POA: Diagnosis not present

## 2022-04-13 LAB — POCT INR: INR: 1 (ref <=1.20)

## 2022-04-13 LAB — COMPREHENSIVE METABOLIC PANEL
Albumin: 3.2 — AB (ref 3.5–5.0)
Globulin: 3.6
eGFR: 74

## 2022-04-13 LAB — HEPATIC FUNCTION PANEL
ALT: 9 U/L — AB (ref 10–40)
AST: 17 (ref 14–40)
Alkaline Phosphatase: 50 (ref 25–125)
Bilirubin, Total: 0.3

## 2022-04-13 LAB — CBC AND DIFFERENTIAL
HCT: 31 — AB (ref 41–53)
Hemoglobin: 10.3 — AB (ref 13.5–17.5)
Neutrophils Absolute: 5.6
Platelets: 305 10*3/uL (ref 150–400)
WBC: 8.4

## 2022-04-13 LAB — BASIC METABOLIC PANEL
Creatinine: 1.1 (ref <=1.3)
Sodium: 140 (ref 137–147)

## 2022-04-13 LAB — CBC: RBC: 3.51 — AB (ref 3.87–5.11)

## 2022-04-27 ENCOUNTER — Ambulatory Visit
Admission: RE | Admit: 2022-04-27 | Discharge: 2022-04-27 | Disposition: A | Discharge status: Home / Self Care | Payer: Medicare Other | Source: Ambulatory Visit | Attending: Nurse Practitioner | Admitting: Nurse Practitioner

## 2022-04-27 ENCOUNTER — Other Ambulatory Visit: Payer: Medicare Other

## 2022-04-27 DIAGNOSIS — K7469 Other cirrhosis of liver: Secondary | ICD-10-CM

## 2022-04-27 DIAGNOSIS — K7460 Unspecified cirrhosis of liver: Secondary | ICD-10-CM | POA: Diagnosis not present

## 2022-06-15 ENCOUNTER — Encounter: Payer: Self-pay | Admitting: Family Medicine

## 2022-06-15 ENCOUNTER — Ambulatory Visit (INDEPENDENT_AMBULATORY_CARE_PROVIDER_SITE_OTHER): Payer: Medicare Other | Admitting: Family Medicine

## 2022-06-15 VITALS — BP 124/84 | HR 66 | Temp 97.4°F | Wt 195.4 lb

## 2022-06-15 DIAGNOSIS — D649 Anemia, unspecified: Secondary | ICD-10-CM | POA: Diagnosis not present

## 2022-06-15 DIAGNOSIS — E785 Hyperlipidemia, unspecified: Secondary | ICD-10-CM

## 2022-06-15 DIAGNOSIS — R32 Unspecified urinary incontinence: Secondary | ICD-10-CM

## 2022-06-15 DIAGNOSIS — Z720 Tobacco use: Secondary | ICD-10-CM

## 2022-06-15 DIAGNOSIS — E669 Obesity, unspecified: Secondary | ICD-10-CM

## 2022-06-15 DIAGNOSIS — R972 Elevated prostate specific antigen [PSA]: Secondary | ICD-10-CM

## 2022-06-15 DIAGNOSIS — K7460 Unspecified cirrhosis of liver: Secondary | ICD-10-CM

## 2022-06-15 DIAGNOSIS — F17210 Nicotine dependence, cigarettes, uncomplicated: Secondary | ICD-10-CM | POA: Diagnosis not present

## 2022-06-15 DIAGNOSIS — F17200 Nicotine dependence, unspecified, uncomplicated: Secondary | ICD-10-CM

## 2022-06-15 DIAGNOSIS — Z125 Encounter for screening for malignant neoplasm of prostate: Secondary | ICD-10-CM

## 2022-06-15 DIAGNOSIS — I10 Essential (primary) hypertension: Secondary | ICD-10-CM

## 2022-06-15 DIAGNOSIS — Z6830 Body mass index (BMI) 30.0-30.9, adult: Secondary | ICD-10-CM

## 2022-06-15 DIAGNOSIS — D509 Iron deficiency anemia, unspecified: Secondary | ICD-10-CM | POA: Diagnosis not present

## 2022-06-15 DIAGNOSIS — Z122 Encounter for screening for malignant neoplasm of respiratory organs: Secondary | ICD-10-CM | POA: Diagnosis not present

## 2022-06-15 DIAGNOSIS — I4729 Other ventricular tachycardia: Secondary | ICD-10-CM

## 2022-06-15 DIAGNOSIS — R351 Nocturia: Secondary | ICD-10-CM | POA: Diagnosis not present

## 2022-06-15 LAB — CBC WITH DIFFERENTIAL/PLATELET
Basophils Absolute: 0.1 10*3/uL (ref 0.0–0.1)
Basophils Relative: 0.8 % (ref 0.0–3.0)
Eosinophils Absolute: 0.3 10*3/uL (ref 0.0–0.7)
Eosinophils Relative: 3.3 % (ref 0.0–5.0)
HCT: 38.5 % — ABNORMAL LOW (ref 39.0–52.0)
Hemoglobin: 12.8 g/dL — ABNORMAL LOW (ref 13.0–17.0)
Lymphocytes Relative: 30.5 % (ref 12.0–46.0)
Lymphs Abs: 2.3 10*3/uL (ref 0.7–4.0)
MCHC: 33.2 g/dL (ref 30.0–36.0)
MCV: 90.5 fl (ref 78.0–100.0)
Monocytes Absolute: 0.7 10*3/uL (ref 0.1–1.0)
Monocytes Relative: 9.3 % (ref 3.0–12.0)
Neutro Abs: 4.3 10*3/uL (ref 1.4–7.7)
Neutrophils Relative %: 56.1 % (ref 43.0–77.0)
Platelets: 230 10*3/uL (ref 150.0–400.0)
RBC: 4.26 Mil/uL (ref 4.22–5.81)
RDW: 15.7 % — ABNORMAL HIGH (ref 11.5–15.5)
WBC: 7.7 10*3/uL (ref 4.0–10.5)

## 2022-06-15 LAB — COMPREHENSIVE METABOLIC PANEL
ALT: 10 U/L (ref 0–53)
AST: 16 U/L (ref 0–37)
Albumin: 3.6 g/dL (ref 3.5–5.2)
Alkaline Phosphatase: 45 U/L (ref 39–117)
BUN: 19 mg/dL (ref 6–23)
CO2: 25 mEq/L (ref 19–32)
Calcium: 8.7 mg/dL (ref 8.4–10.5)
Chloride: 106 mEq/L (ref 96–112)
Creatinine, Ser: 1.34 mg/dL (ref 0.40–1.50)
GFR: 54.1 mL/min — ABNORMAL LOW (ref >60.00)
Glucose, Bld: 98 mg/dL (ref 70–99)
Potassium: 4.1 mEq/L (ref 3.5–5.1)
Sodium: 137 mEq/L (ref 135–145)
Total Bilirubin: 0.4 mg/dL (ref 0.2–1.2)
Total Protein: 6.8 g/dL (ref 6.0–8.3)

## 2022-06-15 LAB — VITAMIN B12: Vitamin B-12: 225 pg/mL (ref 211–911)

## 2022-06-15 LAB — TSH: TSH: 2.05 u[IU]/mL (ref 0.35–5.50)

## 2022-06-15 LAB — LIPID PANEL
Cholesterol: 109 mg/dL (ref 0–200)
HDL: 33.4 mg/dL — ABNORMAL LOW (ref >39.00)
LDL Cholesterol: 57 mg/dL (ref 0–99)
NonHDL: 75.68
Total CHOL/HDL Ratio: 3
Triglycerides: 92 mg/dL (ref 0.0–149.0)
VLDL: 18.4 mg/dL (ref 0.0–40.0)

## 2022-06-15 LAB — FOLATE: Folate: 6.2 ng/mL (ref >5.9)

## 2022-06-15 LAB — FERRITIN: Ferritin: 244.2 ng/mL (ref 22.0–322.0)

## 2022-06-15 LAB — PSA: PSA: 5.1 ng/mL — ABNORMAL HIGH (ref 0.10–4.00)

## 2022-06-15 MED ORDER — METOPROLOL SUCCINATE ER 25 MG PO TB24: 25.0000 mg | ORAL_TABLET | Freq: Every morning | ORAL | 0 refills | Status: DC

## 2022-06-15 MED ORDER — ATORVASTATIN CALCIUM 10 MG PO TABS: 10.0000 mg | ORAL_TABLET | Freq: Every day | ORAL | 1 refills | Status: DC

## 2022-06-15 MED ORDER — TAMSULOSIN HCL 0.4 MG PO CAPS: 0.4000 mg | ORAL_CAPSULE | Freq: Every day | ORAL | 3 refills | Status: DC

## 2022-06-15 MED ORDER — LISINOPRIL-HYDROCHLOROTHIAZIDE 10-12.5 MG PO TABS: 1.0000 | ORAL_TABLET | Freq: Every day | ORAL | 1 refills | Status: DC

## 2022-06-15 NOTE — Assessment & Plan Note (Signed)
Didn't tolerate chantix, didn't want to try other

## 2022-06-15 NOTE — Progress Notes (Signed)
Assessment/Plan:   Problem List Items Addressed This Visit       Cardiovascular and Mediastinum   Primary hypertension    Normotensive Stable Continue current regimen      Relevant Medications   atorvastatin (LIPITOR) 10 MG tablet   lisinopril-hydrochlorothiazide (ZESTORETIC) 10-12.5 MG tablet   metoprolol succinate (TOPROL XL) 25 MG 24 hr tablet   Other Relevant Orders   TSH   AMB Referral to Chronic Care Management Services     Digestive   Hepatic cirrhosis (HCC)   Relevant Orders   TSH   Lipid panel   Microalbumin / creatinine urine ratio   CBC with Differential/Platelet   Comprehensive metabolic panel   Urinalysis, Routine w reflex microscopic   AMB Referral to Chronic Care Management Services     Other   Hyperlipidemia, unspecified    Currently taking Lipitor 10 mg       Relevant Medications   atorvastatin (LIPITOR) 10 MG tablet   lisinopril-hydrochlorothiazide (ZESTORETIC) 10-12.5 MG tablet   metoprolol succinate (TOPROL XL) 25 MG 24 hr tablet   Other Relevant Orders   Lipid panel   AMB Referral to Chronic Care Management Services   Smoking greater than 40 pack years    Counseled on dangers smoking and recommend cessation Did not tolerate Chantix Recommend quit line         Relevant Orders   CT CHEST LUNG CA SCREEN LOW DOSE W/O CM   Nocturia    Likely associated BPH Cannot rule out prostate cancer Check PSA and urine studies ordered, however patient unable to urinate patient did return when can Trial Flomax      Relevant Medications   tamsulosin (FLOMAX) 0.4 MG CAPS capsule   Other Relevant Orders   PSA   Ambulatory referral to Urology   RESOLVED: Tobacco use - Primary    Didn't tolerate chantix, didn't want to try other       Relevant Orders   Ambulatory referral to Smoking Cessation Program   CT CHEST LUNG CA SCREEN LOW DOSE W/O CM   Other Visit Diagnoses     Anemia, unspecified type       Relevant Orders   CBC with  Differential/Platelet   Vitamin B12   Folate   Iron and TIBC   Ferritin   Screening for prostate cancer       Relevant Orders   PSA   Urinary incontinence, unspecified type       Relevant Medications   tamsulosin (FLOMAX) 0.4 MG CAPS capsule   Other Relevant Orders   PSA   Ambulatory referral to Urology   Class 1 obesity with serious comorbidity and body mass index (BMI) of 30.0 to 30.9 in adult, unspecified obesity type       Relevant Orders   TSH   Lipid panel   Microalbumin / creatinine urine ratio   CBC with Differential/Platelet   Comprehensive metabolic panel   Urinalysis, Routine w reflex microscopic   NSVT (nonsustained ventricular tachycardia) (HCC)       Relevant Medications   atorvastatin (LIPITOR) 10 MG tablet   lisinopril-hydrochlorothiazide (ZESTORETIC) 10-12.5 MG tablet   metoprolol succinate (TOPROL XL) 25 MG 24 hr tablet   Screening for lung cancer              Subjective:  HPI:  Hector Matthews is a 71 y.o. male who has Primary hypertension; Bradycardia; First degree heart block; Hepatic cirrhosis ([place]); Hyperlipidemia, unspecified; Smoking greater than 40 pack years; and  Nocturia on their problem list..   He  has a past medical history of Allergy, Cirrhosis ([place]), Hepatitis C, Hyperlipidemia, Hypertension, Nonsustained ventricular tachycardia ([place]), and Premature ventricular contractions.Marland Kitchen   He presents with chief complaint of Follow-up (3 month follow up b/p and smoking. Chantix made patient feel sleepy and angry ) .    Hypertension, established problem, Stable BP Readings from Last 3 Encounters:  06/15/22 124/84  03/09/22 128/82  02/22/22 126/82    Current Medications: Lisinopril/HCTZ and metoprolol, compliant without side effects. Interim History:   Patient Hector Matthews is some shortness of breath briefly when he was in [place].  He has had since had resolution.  ROS: Denies any chest pain, dyspnea on exertion, leg edema.   Hyperlipidemia,  established problem Current medication(s): Atorvastatin 10 mg.  Compliant without side effects.  ROS: No chest pain  No myalgias.  Benign Prostatic Hypertrophy: Patient complains of lower urinary tract symptoms. Patient reports mild symptoms of BPH. Onset of symptoms was a few months ago and was gradual in onset. Symptomsmanifested as irritative symptoms including urgency, nocturia and obstructive symptoms including weak stream. He has no personal history of prostate cancer.    He denies  hematuria, dysuria .  He reports a  history of no complicating symptoms.  He denies flank pain, gross hematuria, kidney stones, and recurrent UTI.   Tobacco abuse.  Patient tried Chantix for 1 week.  It made him irritable and tired.  He discontinued it.  He has returned to smoking, has smoked 1 to 2 packs for greater than 30 years.  He is not interested in quitting at the moment.  He declines patches and other medications.  Anemia: Patient presents for presents evaluation of anemia. Anemia was found by routine CBC.  It has been present for a few months.  Associated signs & symptoms: none.  Tobacco Use: High Risk (06/15/2022)   Patient History    Smoking Tobacco Use: Every Day    Smokeless Tobacco Use: Never    Passive Exposure: Not on file    Past Surgical History:  Procedure Laterality Date   COLONOSCOPY     GANGLION CYST EXCISION Right    foot   POLYPECTOMY      Outpatient Medications Prior to Visit  Medication Sig Dispense Refill   ibuprofen (ADVIL,MOTRIN) 200 MG tablet Take 200 mg by mouth every 6 (six) hours as needed.     atorvastatin (LIPITOR) 10 MG tablet TK 1 T PO QD     lisinopril-hydrochlorothiazide (PRINZIDE,ZESTORETIC) 10-12.5 MG per tablet Take 1 tablet by mouth daily.     metoprolol succinate (TOPROL XL) 25 MG 24 hr tablet Take 1 tablet (25 mg total) by mouth every morning. 180 tablet 0   Varenicline Tartrate, Starter, (CHANTIX STARTING MONTH PAK) 0.5 MG X 11 & 1 MG X 42 TBPK Initial,  0.5 mg orally once daily for 3 days, then 0.5 mg twice daily on days 4 through 7, and then 1 mg twice daily thereafter for 12 weeks of treatment; 12 additional weeks of therapy may increase likelihood of long-term abstinence in patients who successfully stop smoking (Patient not taking: Reported on 03/20/2022) 53 each 0   No facility-administered medications prior to visit.    Family History  Problem Relation Age of Onset   Pancreatic cancer Father    Cancer Father    Hypertension Brother        x 2   Colon cancer Neg Hx    Colon polyps Neg Hx  Esophageal cancer Neg Hx    Rectal cancer Neg Hx    Stomach cancer Neg Hx     Social History   Socioeconomic History   Marital status: Married    Spouse name: Not on file   Number of children: 0   Years of education: Not on file   Highest education level: Not on file  Occupational History   Occupation: truck Adult nurse  Tobacco Use   Smoking status: Every Day    Packs/day: 1.00    Years: 22.00    Total pack years: 22.00    Types: Cigarettes   Smokeless tobacco: Never  Vaping Use   Vaping Use: Never used  Substance and Sexual Activity   Alcohol use: No   Drug use: No   Sexual activity: Not on file  Other Topics Concern   Not on file  Social History Narrative   Not on file   Social Determinants of Health   Financial Resource Strain: Low Risk  (03/20/2022)   Overall Financial Resource Strain (CARDIA)    Difficulty of Paying Living Expenses: Not hard at all  Food Insecurity: No Food Insecurity (03/20/2022)   Hunger Vital Sign    Worried About Running Out of Food in the Last Year: Never true    [place] in the Last Year: Never true  Transportation Needs: No Transportation Needs (03/20/2022)   PRAPARE - Hydrologist (Medical): No    Lack of Transportation (Non-Medical): No  Physical Activity: Inactive (03/20/2022)   Exercise Vital Sign    Days of Exercise per Week: 0 days     Minutes of Exercise per Session: 0 min  Stress: No Stress Concern Present (03/20/2022)   [place]    Feeling of Stress : Not at all  Social Connections: Moderately Isolated (03/20/2022)   Social Connection and Isolation Panel [NHANES]    Frequency of Communication with Friends and Family: Twice a week    Frequency of Social Gatherings with Friends and Family: Twice a week    Attends Religious Services: Never    Marine scientist or Organizations: No    Attends Archivist Meetings: Never    Marital Status: Married  Human resources officer Violence: Not At Risk (03/20/2022)   Humiliation, Afraid, Rape, and Kick questionnaire    Fear of Current or Ex-Partner: No    Emotionally Abused: No    Physically Abused: No    Sexually Abused: No                                                                                                 Objective:  Physical Exam: BP 124/84 (BP Location: Left Arm, Patient Position: Sitting, Cuff Size: Large)   Pulse 66   Temp (!) 97.4 F (36.3 C) (Temporal)   Wt 195 lb 6.4 oz (88.6 kg)   SpO2 97%   BMI 31.07 kg/m    Gen: NAD, resting comfortably CV: RRR with no murmurs appreciated Pulm: NWOB, CTAB with no crackles, wheezes, or rhonchi GI:  Normal bowel sounds present. Soft, Nontender, Nondistended. MSK: no edema, cyanosis, or clubbing noted Skin: warm, dry Neuro: grossly normal, moves all extremities Psych: Normal affect and thought content        Alesia Banda, MD, MS

## 2022-06-15 NOTE — Assessment & Plan Note (Signed)
Normotensive Stable Continue current regimen

## 2022-06-15 NOTE — Patient Instructions (Signed)
For urinary issues, start flomax. We are checking prostate blood test. We are referring to urology to make sure its not cancer.   For blood pressure, cholesterol, we are rechecking labs.   Continue to work on reducing smoking, exercise, and eating a healthy low fat diet.

## 2022-06-15 NOTE — Assessment & Plan Note (Signed)
Currently taking Lipitor 10 mg

## 2022-06-15 NOTE — Assessment & Plan Note (Signed)
Counseled on dangers smoking and recommend cessation Did not tolerate Chantix Recommend quit line

## 2022-06-15 NOTE — Assessment & Plan Note (Signed)
Likely associated BPH Cannot rule out prostate cancer Check PSA and urine studies ordered, however patient unable to urinate patient did return when can Trial Flomax

## 2022-06-16 LAB — IRON AND TIBC
Iron Saturation: 33 % (ref 15–55)
Iron: 76 ug/dL (ref 38–169)
Total Iron Binding Capacity: 230 ug/dL — ABNORMAL LOW (ref 250–450)
UIBC: 154 ug/dL (ref 111–343)

## 2022-06-18 MED ORDER — IRON (FERROUS SULFATE) 325 (65 FE) MG PO TABS: 325.0000 mg | ORAL_TABLET | Freq: Every day | ORAL | 3 refills | Status: AC

## 2022-06-18 NOTE — Addendum Note (Signed)
Addended by: Josephine Igo B on: 06/18/2022 09:31 AM   Modules accepted: Orders

## 2022-06-18 NOTE — Addendum Note (Signed)
Addended by: Josephine Igo B on: 06/18/2022 09:30 AM   Modules accepted: Orders

## 2022-06-27 NOTE — Addendum Note (Signed)
Addended by: Beryle Lathe S on: 06/27/2022 03:50 PM   Modules accepted: Orders

## 2022-07-03 ENCOUNTER — Telehealth: Payer: Self-pay

## 2022-07-03 NOTE — Progress Notes (Signed)
  Chronic Care Management   Note  07/03/2022 Name: Arvind Mexicano MRN: [id number] DOB: [birth year]  Jodey Burbano is a 72 y.o. year old male who is a primary care patient of Bonnita Hollow, MD. I reached out to [name] by phone today in response to a referral sent by Mr. Jaheim Canino PCP.  Mr. Jorrell Kuster  declinedto scheduling an appointment with the CCM RN Case Manager and pharm d   Follow up plan: Patient did not agree to scheduling an appointment with the RN Case Manager pharm d. The ordering provider has been notified.   Noreene Larsson, [place], [place] [address] Direct Dial: [phone] [email or web address]

## 2022-07-07 IMAGING — US US ABDOMEN LIMITED
1 series · 14 of 25 positions shown · non-contrast
Comparison: Most recent exam 02/10/2021

CLINICAL DATA: Liver cirrhosis.

EXAM:
ULTRASOUND ABDOMEN LIMITED RIGHT UPPER QUADRANT

[Series 1: us abdomen limited · 0.20mm/px · 14 of 45 slices shown]
[im 1/45]
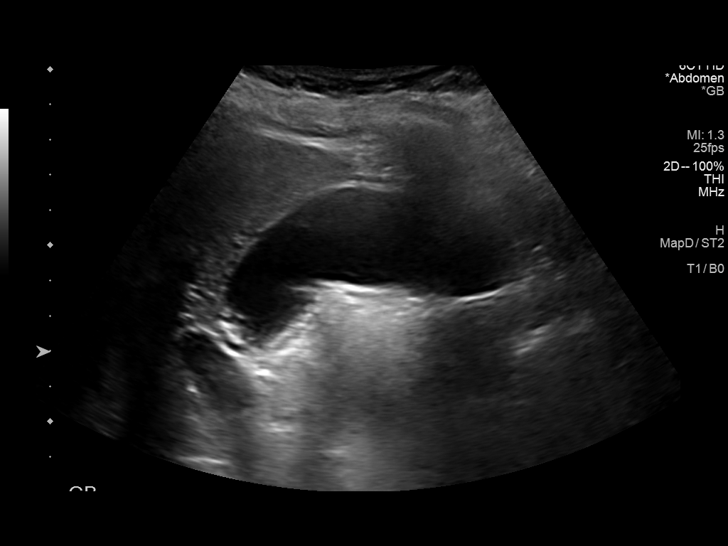
[im 4/45]
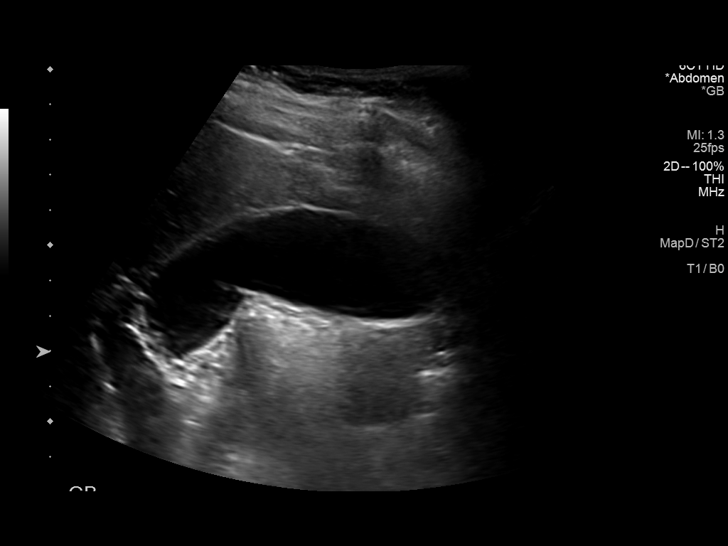
[im 8/45]
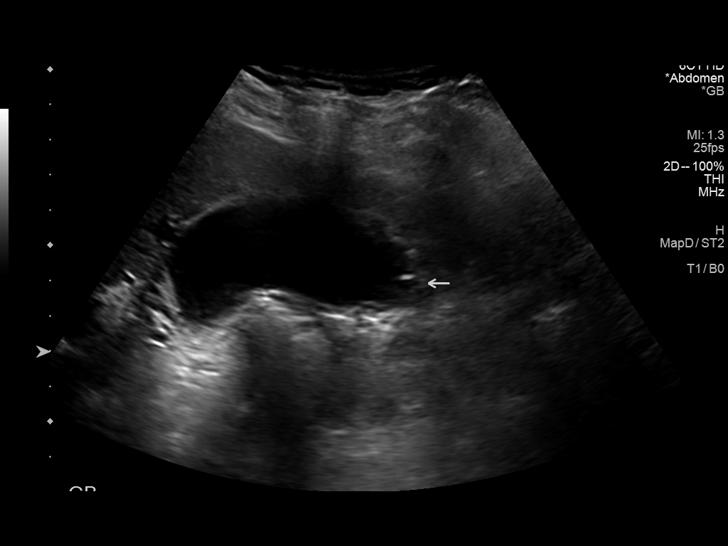
[im 12/45]
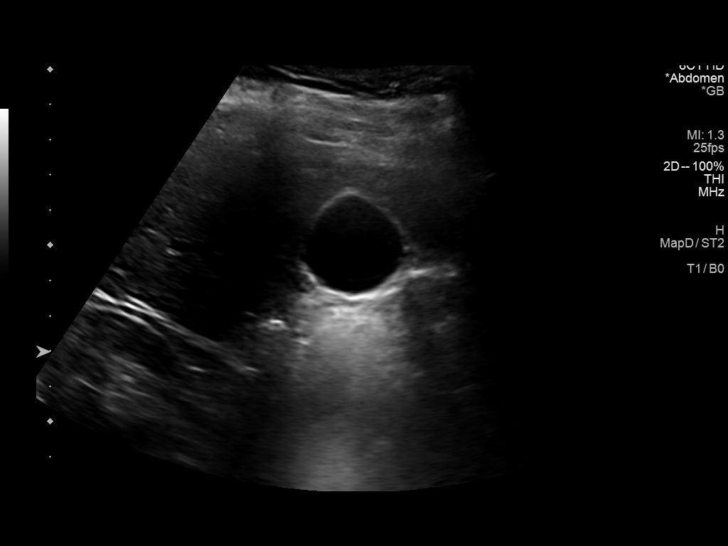
[im 15/45]
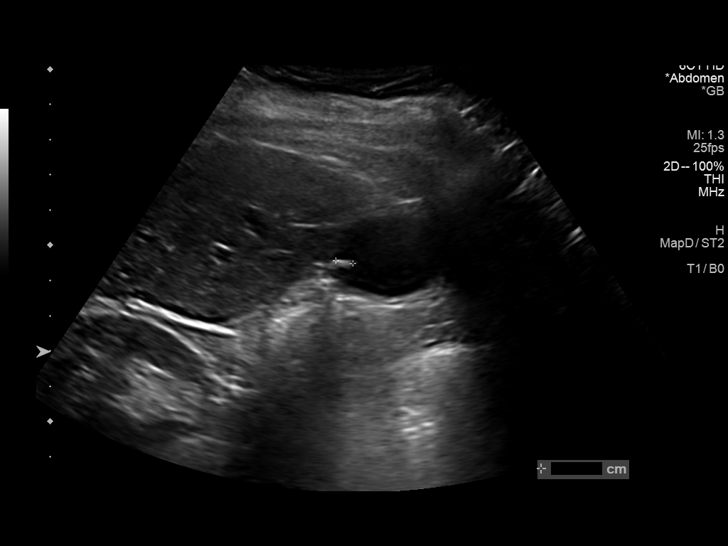
[im 17/45]
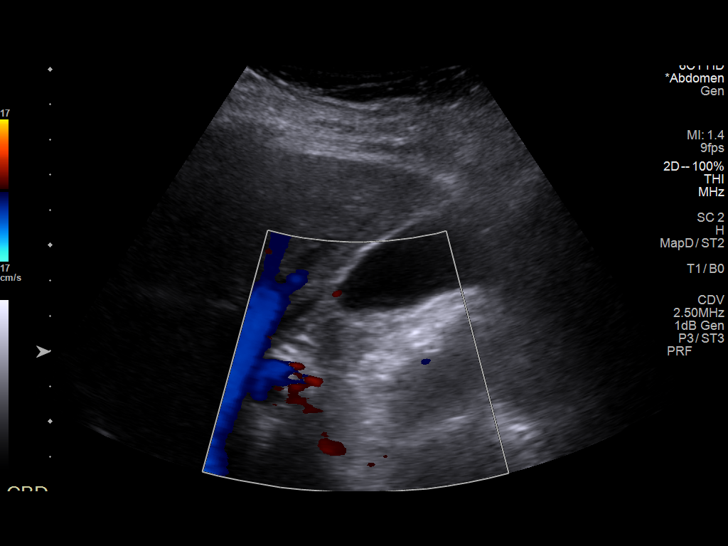
[im 21/45]
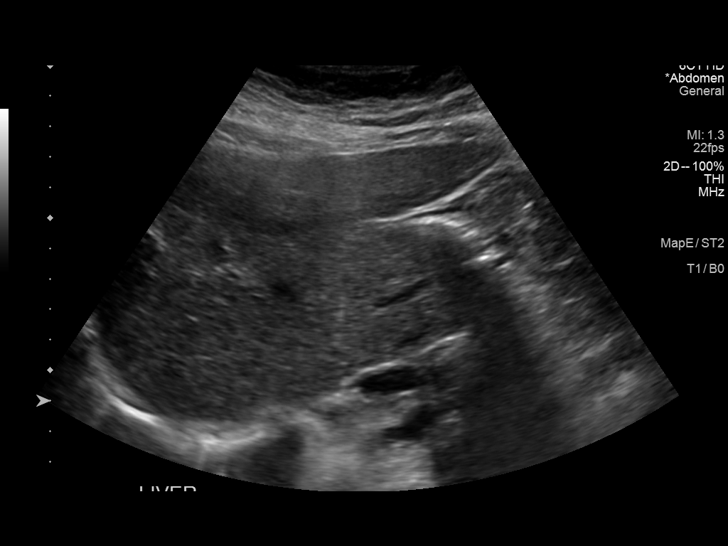
[im 24/45]
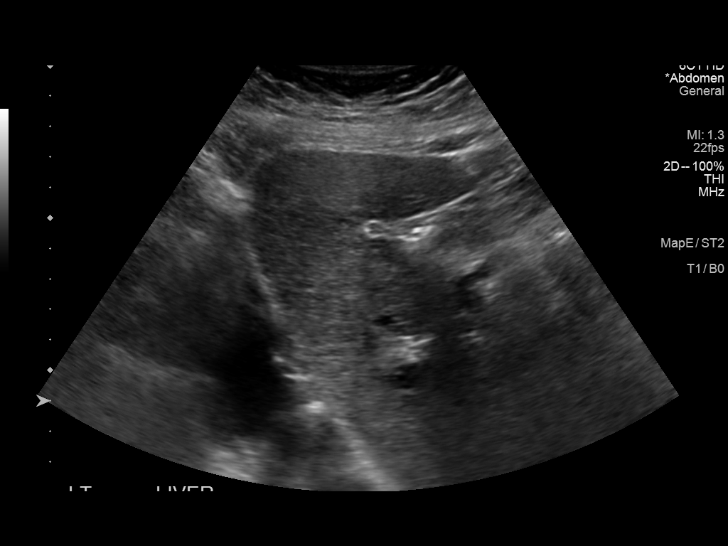
[im 28/45]
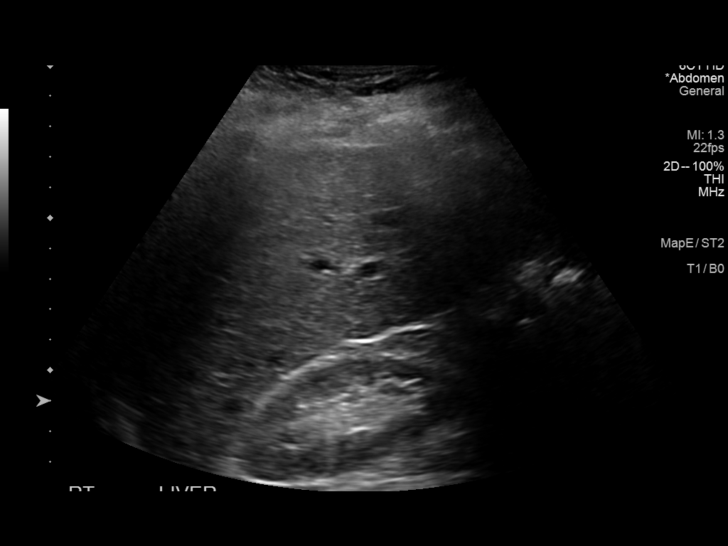
[im 30/45]
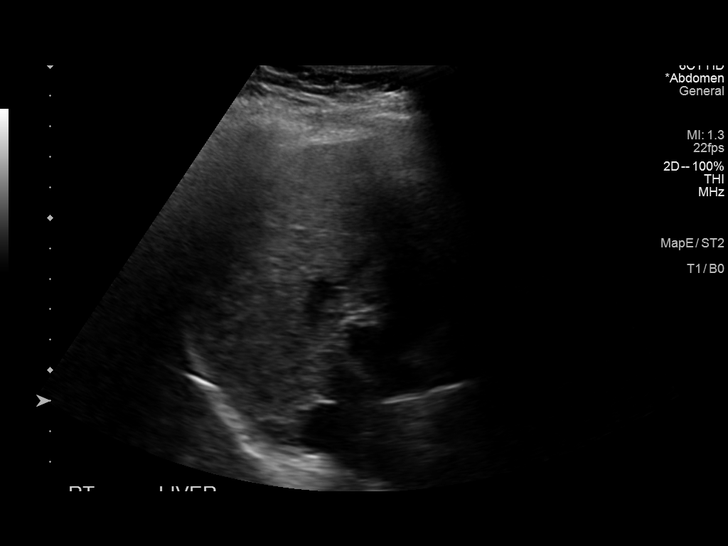
[im 34/45]
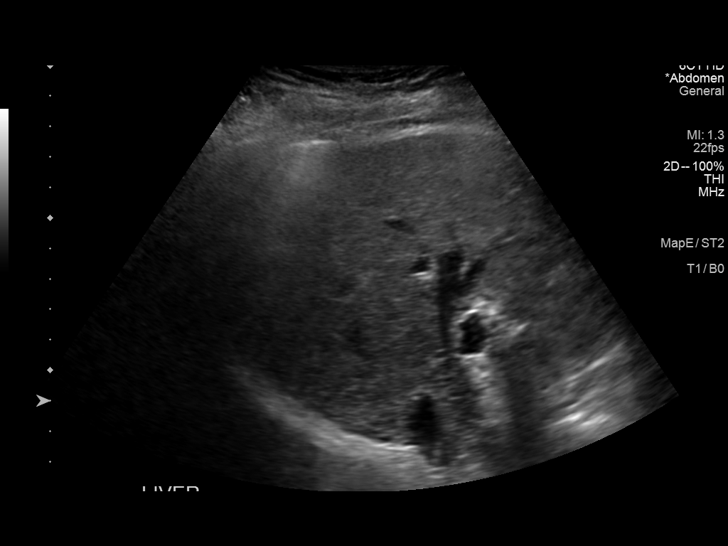
[im 37/45]
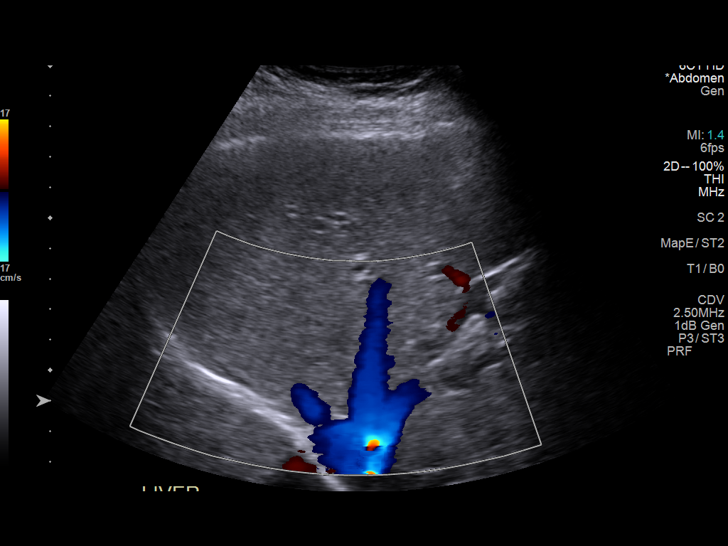
[im 41/45]
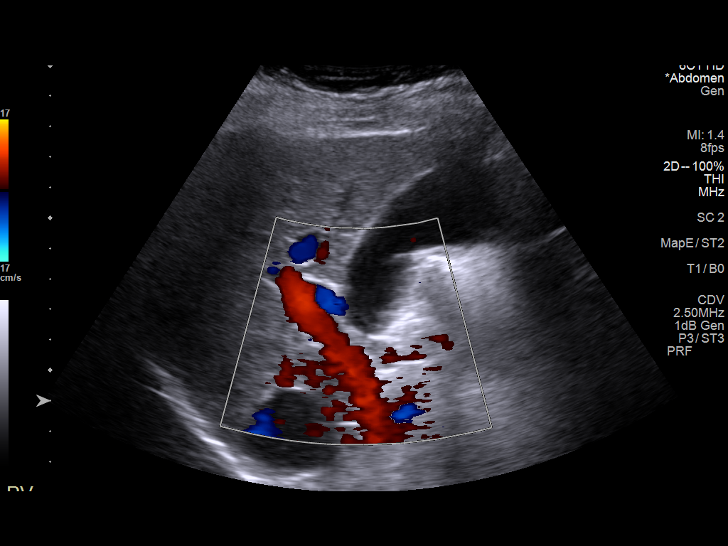
[im 45/45]
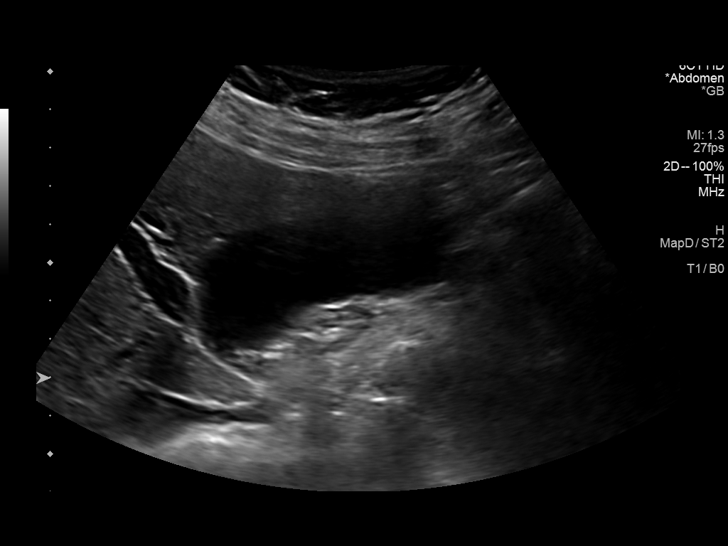

[14 of 25 positions shown; findings below may reference images not displayed]

FINDINGS: Gallbladder:

Physiologically distended. 5 mm echogenic focus in the gallbladder
fundus may represent an adherent stone or polyp. No gallbladder wall
thickening. No sonographic Murphy sign noted by sonographer.

Common bile duct:

Diameter: 3-4 mm, normal.

Liver:

Coarsened heterogeneous parenchymal echogenicity. No focal liver
lesion. No capsular nodularity. Portal vein is patent on color
Doppler imaging with normal direction of blood flow towards the
liver.

Other: No right upper quadrant ascites.
IMPRESSION: 1. Coarsened hepatic parenchymal echogenicity without focal liver
lesion.
2. Adherent gallstone versus gallbladder polyp.

## 2022-08-03 ENCOUNTER — Ambulatory Visit (HOSPITAL_BASED_OUTPATIENT_CLINIC_OR_DEPARTMENT_OTHER)
Admission: RE | Admit: 2022-08-03 | Discharge: 2022-08-03 | Disposition: A | Discharge status: Home / Self Care | Payer: Medicare Other | Source: Ambulatory Visit | Attending: Family Medicine | Admitting: Family Medicine

## 2022-08-03 ENCOUNTER — Ambulatory Visit (INDEPENDENT_AMBULATORY_CARE_PROVIDER_SITE_OTHER): Payer: Medicare Other | Admitting: Urology

## 2022-08-03 ENCOUNTER — Ambulatory Visit (HOSPITAL_BASED_OUTPATIENT_CLINIC_OR_DEPARTMENT_OTHER): Payer: Medicare Other

## 2022-08-03 ENCOUNTER — Encounter: Payer: Self-pay | Admitting: Urology

## 2022-08-03 VITALS — BP 163/90 | HR 52 | Ht 68.0 in | Wt 200.0 lb

## 2022-08-03 DIAGNOSIS — F17210 Nicotine dependence, cigarettes, uncomplicated: Secondary | ICD-10-CM | POA: Insufficient documentation

## 2022-08-03 DIAGNOSIS — N138 Other obstructive and reflux uropathy: Secondary | ICD-10-CM

## 2022-08-03 DIAGNOSIS — R972 Elevated prostate specific antigen [PSA]: Secondary | ICD-10-CM

## 2022-08-03 DIAGNOSIS — N401 Enlarged prostate with lower urinary tract symptoms: Secondary | ICD-10-CM

## 2022-08-03 DIAGNOSIS — R8290 Unspecified abnormal findings in urine: Secondary | ICD-10-CM | POA: Diagnosis not present

## 2022-08-03 DIAGNOSIS — Z720 Tobacco use: Secondary | ICD-10-CM | POA: Insufficient documentation

## 2022-08-03 LAB — URINALYSIS
Bilirubin, UA: NEGATIVE
Glucose, UA: NEGATIVE mg/dL
Ketones, UA: NEGATIVE
Leukocytes, UA: NEGATIVE
Nitrite, UA: NEGATIVE
Protein, UA: NEGATIVE
Spec Grav, UA: 1.025 (ref 1.010–1.025)
Urobilinogen, UA: 0.2 E.U./dL
pH, UA: 5.5 (ref 5.0–8.0)

## 2022-08-03 LAB — BLADDER SCAN AMB NON-IMAGING

## 2022-08-03 MED ORDER — MIRABEGRON ER 25 MG PO TB24: 25.0000 mg | ORAL_TABLET | Freq: Every day | ORAL | 0 refills | Status: DC

## 2022-08-03 NOTE — Progress Notes (Signed)
Assessment: 1. Elevated PSA   2. BPH with obstruction/lower urinary tract symptoms   3. Abnormal urine findings     Plan: I reviewed the patient's chart including provider notes and lab results. Microscopic U/A sent today Will contact him with results Today I had a discussion with the patient regarding the findings of microscopic hematuria including the implications and differential diagnoses associated with it.  I also discussed recommendations for further evaluation including the rationale for upper tract imaging and cystoscopy.  I discussed the nature of these procedures including potential risk and complications.  The patient expressed an understanding of these issues. Today I had a discussion with the patient regarding PSA and the rationale and controversies of prostate cancer early detection.  I discussed the pros and cons of further evaluation including TRUS and prostate Bx.  Potential adverse events and complications as well as standard instructions were given.  Patient expressed his understanding of these issues. Consider free and total PSA next visit  Trial of Myrbetriq 25 mg daily.  Samples given. Return to office in 4 weeks  Chief Complaint:  Chief Complaint  Patient presents with   Elevated PSA   Benign Prostatic Hypertrophy    History of Present Illness:  Hector Matthews is a 71 y.o. male who is seen in consultation from Bonnita Hollow, MD for evaluation of lower urinary tract symptoms and elevated PSA. He reports worsening of his lower urinary tract symptoms for the past several months.  He has urgency with occasional urge incontinence, nocturia x 1, intermittent stream, decreased force of stream, and sensation of incomplete emptying.  No dysuria or gross hematuria.  No history of UTIs.  He was started on tamsulosin in November 2023 and has not noted some improvement in his urinary symptoms. IPSS = 18 today.  PSA from 11/23: 5.10. No prior PSA results available for  review.  He is not aware of a previously elevated PSA test.  No prior prostate biopsy.  No family history of prostate cancer.  Truck driver.  He has a history of tobacco use smoking 1 pack/day for approximately 15 years.  He previously had quit for about 20 years.  Past Medical History:  Past Medical History:  Diagnosis Date   Allergy    Cirrhosis ([place])    Hepatitis C    Hyperlipidemia    Hypertension    Nonsustained ventricular tachycardia (HCC)    Asymptomatic, noted on Holter monitor   Premature ventricular contractions     Past Surgical History:  Past Surgical History:  Procedure Laterality Date   COLONOSCOPY     GANGLION CYST EXCISION Right    foot   POLYPECTOMY      Allergies:  Allergies  Allergen Reactions   Chantix [Varenicline] Other (See Comments)    Sedation    Family History:  Family History  Problem Relation Age of Onset   Pancreatic cancer Father    Cancer Father    Hypertension Brother        x 2   Colon cancer Neg Hx    Colon polyps Neg Hx    Esophageal cancer Neg Hx    Rectal cancer Neg Hx    Stomach cancer Neg Hx     Social History:  Social History   Tobacco Use   Smoking status: Every Day    Packs/day: 1.00    Years: 22.00    Total pack years: 22.00    Types: Cigarettes   Smokeless tobacco: Never  Vaping  Use   Vaping Use: Never used  Substance Use Topics   Alcohol use: No   Drug use: No    Review of symptoms:  Constitutional:  Negative for unexplained weight loss, night sweats, fever, chills ENT:  Negative for nose bleeds, sinus pain, painful swallowing CV:  Negative for chest pain, shortness of breath, exercise intolerance, palpitations, loss of consciousness Resp:  Negative for cough, wheezing, shortness of breath GI:  Negative for nausea, vomiting, diarrhea, bloody stools GU:  Positives noted in HPI; otherwise negative for gross hematuria, dysuria Neuro:  Negative for seizures, poor balance, limb weakness, slurred  speech Psych:  Negative for lack of energy, depression, anxiety Endocrine:  Negative for polydipsia, polyuria, symptoms of hypoglycemia (dizziness, hunger, sweating) Hematologic:  Negative for anemia, purpura, petechia, prolonged or excessive bleeding, use of anticoagulants  Allergic:  Negative for difficulty breathing or choking as a result of exposure to anything; no shellfish allergy; no allergic response (rash/itch) to materials, foods  Physical exam: BP (!) 163/90   Pulse (!) 52   Ht '5\' 8"'$  (1.727 m)   Wt 200 lb (90.7 kg)   BMI 30.41 kg/m  GENERAL APPEARANCE:  Well appearing, well developed, well nourished, NAD HEENT: Atraumatic, Normocephalic, oropharynx clear. NECK: Supple without lymphadenopathy or thyromegaly. LUNGS: Clear to auscultation bilaterally. HEART: Regular Rate and Rhythm without murmurs, gallops, or rubs. ABDOMEN: Soft, non-tender, No Masses. EXTREMITIES: Moves all extremities well.  Without clubbing, cyanosis, or edema. NEUROLOGIC:  Alert and oriented x 3, normal gait, CN II-XII grossly intact.  MENTAL STATUS:  Appropriate. BACK:  Non-tender to palpation.  No CVAT SKIN:  Warm, dry and intact.   GU: Penis:  circumcised Meatus: Normal Scrotum: normal, no masses Testis: normal without masses bilateral Epididymis: normal Prostate: 40 g, NT, no nodules Rectum: Normal tone,  no masses or tenderness   Results: U/A dipstick: 2+ blood  PVR:  74 ml

## 2022-08-04 LAB — URINALYSIS, MICROSCOPIC ONLY
Bacteria, UA: NONE SEEN
Casts: NONE SEEN /lpf
RBC, Urine: NONE SEEN /hpf (ref 0–2)
WBC, UA: NONE SEEN /hpf (ref 0–5)

## 2022-09-07 ENCOUNTER — Encounter: Payer: Self-pay | Admitting: Family Medicine

## 2022-09-07 ENCOUNTER — Encounter: Payer: Self-pay | Admitting: Urology

## 2022-09-07 ENCOUNTER — Ambulatory Visit (INDEPENDENT_AMBULATORY_CARE_PROVIDER_SITE_OTHER): Payer: Medicare Other | Admitting: Urology

## 2022-09-07 ENCOUNTER — Ambulatory Visit (INDEPENDENT_AMBULATORY_CARE_PROVIDER_SITE_OTHER): Payer: Medicare Other | Admitting: Family Medicine

## 2022-09-07 VITALS — BP 135/86 | HR 48 | Ht 68.0 in | Wt 201.0 lb

## 2022-09-07 VITALS — BP 124/84 | HR 75 | Temp 97.8°F | Wt 201.4 lb

## 2022-09-07 DIAGNOSIS — I10 Essential (primary) hypertension: Secondary | ICD-10-CM | POA: Diagnosis not present

## 2022-09-07 DIAGNOSIS — N138 Other obstructive and reflux uropathy: Secondary | ICD-10-CM

## 2022-09-07 DIAGNOSIS — N401 Enlarged prostate with lower urinary tract symptoms: Secondary | ICD-10-CM | POA: Diagnosis not present

## 2022-09-07 DIAGNOSIS — J439 Emphysema, unspecified: Secondary | ICD-10-CM

## 2022-09-07 DIAGNOSIS — C220 Liver cell carcinoma: Secondary | ICD-10-CM | POA: Diagnosis not present

## 2022-09-07 DIAGNOSIS — I4729 Other ventricular tachycardia: Secondary | ICD-10-CM | POA: Diagnosis not present

## 2022-09-07 DIAGNOSIS — R972 Elevated prostate specific antigen [PSA]: Secondary | ICD-10-CM | POA: Diagnosis not present

## 2022-09-07 DIAGNOSIS — K7460 Unspecified cirrhosis of liver: Secondary | ICD-10-CM | POA: Diagnosis not present

## 2022-09-07 LAB — URINALYSIS
Bilirubin, UA: NEGATIVE
Glucose, UA: NEGATIVE mg/dL
Ketones, POC UA: NEGATIVE mg/dL
Leukocytes, UA: NEGATIVE
Nitrite, UA: NEGATIVE
Protein Ur, POC: NEGATIVE mg/dL
Spec Grav, UA: 1.015 (ref 1.010–1.025)
Urobilinogen, UA: 0.2 E.U./dL
pH, UA: 7 (ref 5.0–8.0)

## 2022-09-07 MED ORDER — TAMSULOSIN HCL 0.4 MG PO CAPS: 0.4000 mg | ORAL_CAPSULE | Freq: Every day | ORAL | 11 refills | Status: DC

## 2022-09-07 MED ORDER — ALBUTEROL SULFATE HFA 108 (90 BASE) MCG/ACT IN AERS: 2.0000 | INHALATION_SPRAY | Freq: Four times a day (QID) | RESPIRATORY_TRACT | 0 refills | Status: DC | PRN

## 2022-09-07 MED ORDER — MIRABEGRON ER 25 MG PO TB24: 25.0000 mg | ORAL_TABLET | Freq: Every day | ORAL | 11 refills | Status: DC

## 2022-09-07 NOTE — Assessment & Plan Note (Signed)
Seen on CT chest.  Associated with mildly intermittent wheezing, likely beginnings of COPD.  Patient encouraged to stop smoking.  Albuterol inhaler as needed shortness of breath.  Patient follow-up as needed

## 2022-09-07 NOTE — Progress Notes (Signed)
Assessment/Plan:   Problem List Items Addressed This Visit       Cardiovascular and Mediastinum   Primary hypertension    Considering Hector Matthews's good blood pressure control with only the metoprolol medication, continue metoprolol along with current approach of lifestyle and dietary modifications. Monitor blood pressure at home periodically, and if readings consistently stay within target range, ongoing suspension of antihypertensive medication may be considered appropriate. Encourage healthy diet and regular exercise. If blood pressure increases, a reevaluation of the need for antihypertensive medication is warranted.      NSVT (nonsustained ventricular tachycardia) (HCC)     Respiratory   Pulmonary emphysema (HCC) - Primary    Seen on CT chest.  Associated with mildly intermittent wheezing, likely beginnings of COPD.  Patient encouraged to stop smoking.  Albuterol inhaler as needed shortness of breath.  Patient follow-up as needed      Relevant Medications   albuterol (VENTOLIN HFA) 108 (90 Base) MCG/ACT inhaler     Digestive   Hepatic cirrhosis (HCC)   Hepatoma (HCC)    There are no discontinued medications.    Subjective:  HPI: Encounter date: 09/07/2022  Hector Matthews is a 72 y.o. male who has Primary hypertension; Bradycardia; First degree heart block; Hepatic cirrhosis ([place]); Hyperlipidemia, unspecified; Smoking greater than 40 pack years; Nocturia; Elevated PSA; Hepatoma [facility]); NSVT (nonsustained ventricular tachycardia) ([place]); and Pulmonary emphysema ([place]) on their problem list..   He  has a past medical history of Allergy, Cirrhosis ([place]), Hepatitis C, Hyperlipidemia, Hypertension, Nonsustained ventricular tachycardia ([place]), and Premature ventricular contractions.Marland Kitchen   He presents with chief complaint of Follow-up (cholesterol,bp,urinary issues. Has been off b/p medication for 1 month since being on the road. Has been taking apple cider vinegar, b12 , beet root for b/p.  Rx refill iron) .  HISTORY OF PRESENT ILLNESS:  Problem 1: Hector Matthews has not taken his lisinopril hydrochlorothiazide pressure medication for approximately a month, opting instead for various supplements. He reports routine checks of his blood pressure at home, noting that readings appear satisfactory. Hector Matthews's compliance with other prescribed medications, including atorvastatin for cholesterol management, remains intact.  Problem 2: Hector Matthews also experiences improvement with urinary symptoms following the use of tamsulosin and Myrbetriq, as prescribed by a urologist. He has described previous incidents of urgency and incontinence, which have since lessened on his current regime.  Problem 3: Hector Matthews has a significant history of tobacco use. 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 He is occasionally involved in yard work and home maintenance.  CT Chest showed emphysematous changes  REVIEW OF SYSTEMS: Hector Matthews has had no chest pain or joint pains. He has noted shortness of breath with exertion, particularly at higher elevations, but no signs of wheezing or coughing during the encounter. All other systems are reviewed and are negative.  Past Surgical History:  Procedure Laterality Date   COLONOSCOPY     GANGLION CYST EXCISION Right    foot   POLYPECTOMY      Outpatient Medications Prior to Visit  Medication Sig Dispense Refill   atorvastatin (LIPITOR) 10 MG tablet Take 1 tablet (10 mg total) by mouth daily. 90 tablet 1   ibuprofen (ADVIL,MOTRIN) 200 MG tablet Take 200 mg by mouth every 6 (six) hours as needed.     Iron, Ferrous Sulfate, 325 (65 Fe) MG TABS Take 325 mg by mouth daily. 90 tablet 3   mirabegron ER (MYRBETRIQ) 25 MG TB24 tablet Take 1 tablet (25 mg total) by  mouth daily. 30 tablet 11   tamsulosin (FLOMAX) 0.4 MG CAPS capsule Take 1 capsule (0.4 mg total) by mouth daily. 30 capsule 11   mirabegron ER (MYRBETRIQ) 25 MG TB24 tablet Take 1 tablet  (25 mg total) by mouth daily. 28 tablet 0   tamsulosin (FLOMAX) 0.4 MG CAPS capsule Take 1 capsule (0.4 mg total) by mouth daily. 30 capsule 3   lisinopril-hydrochlorothiazide (ZESTORETIC) 10-12.5 MG tablet Take 1 tablet by mouth daily. 90 tablet 1   metoprolol succinate (TOPROL XL) 25 MG 24 hr tablet Take 1 tablet (25 mg total) by mouth every morning. 180 tablet 0   No facility-administered medications prior to visit.    Family History  Problem Relation Age of Onset   Pancreatic cancer Father    Cancer Father    Hypertension Brother        x 2   Colon cancer Neg Hx    Colon polyps Neg Hx    Esophageal cancer Neg Hx    Rectal cancer Neg Hx    Stomach cancer Neg Hx     Social History   Socioeconomic History   Marital status: Married    Spouse name: Not on file   Number of children: 0   Years of education: Not on file   Highest education level: Not on file  Occupational History   Occupation: truck Adult nurse  Tobacco Use   Smoking status: Every Day    Packs/day: 1.00    Years: 22.00    Total pack years: 22.00    Types: Cigarettes   Smokeless tobacco: Never  Vaping Use   Vaping Use: Never used  Substance and Sexual Activity   Alcohol use: No   Drug use: No   Sexual activity: Not on file  Other Topics Concern   Not on file  Social History Narrative   Not on file   Social Determinants of Health   Financial Resource Strain: Low Risk  (03/20/2022)   Overall Financial Resource Strain (CARDIA)    Difficulty of Paying Living Expenses: Not hard at all  Food Insecurity: No Food Insecurity (03/20/2022)   Hunger Vital Sign    Worried About Running Out of Food in the Last Year: Never true    Ran Out of Food in the Last Year: Never true  Transportation Needs: No Transportation Needs (03/20/2022)   PRAPARE - Hydrologist (Medical): No    Lack of Transportation (Non-Medical): No  Physical Activity: Inactive (03/20/2022)   Exercise Vital  Sign    Days of Exercise per Week: 0 days    Minutes of Exercise per Session: 0 min  Stress: No Stress Concern Present (03/20/2022)   [place]    Feeling of Stress : Not at all  Social Connections: Moderately Isolated (03/20/2022)   Social Connection and Isolation Panel [NHANES]    Frequency of Communication with Friends and Family: Twice a week    Frequency of Social Gatherings with Friends and Family: Twice a week    Attends Religious Services: Never    Marine scientist or Organizations: No    Attends Archivist Meetings: Never    Marital Status: Married  Human resources officer Violence: Not At Risk (03/20/2022)   Humiliation, Afraid, Rape, and Kick questionnaire    Fear of Current or Ex-Partner: No    Emotionally Abused: No    Physically Abused: No    Sexually Abused: No  Objective:  Physical Exam: BP 124/84 (BP Location: Left Arm, Patient Position: Sitting, Cuff Size: Large)   Pulse 75   Temp 97.8 F (36.6 C) (Temporal)   Wt 201 lb 6.4 oz (91.4 kg)   SpO2 99%   BMI 30.62 kg/m    General: No acute distress. Awake and conversant.  Eyes: Normal conjunctiva, anicteric. Round symmetric pupils.  ENT: Hearing grossly intact. No nasal discharge.  Neck: Neck is supple. No masses or thyromegaly.  Respiratory: Respirations are non-labored. No auditory wheezing.  Skin: Warm. No rashes or ulcers.  Psych: Alert and oriented. Cooperative, Appropriate mood and affect, Normal judgment.  CV: No cyanosis or JVD, RRR, MRG MSK: Normal ambulation. No clubbing  Neuro: Sensation and CN II-XII grossly normal.   LABS: Prior labs available were reviewed. The assessment includes references to normal iron levels in November, normal cholesterol levels with improvement, stable CMP and electrolytes in November, and the liver  function within normal limits in October. Kidney function normal. PSA and urinalysis performed on the day of the visit appeared fine.  IMAGING: CT scan in November showed signs of emphysema and a benign nodule but no evidence of lung cancer.     Alesia Banda, MD, MS

## 2022-09-07 NOTE — Assessment & Plan Note (Signed)
Considering Hector Matthews's good blood pressure control with only the metoprolol medication, continue metoprolol along with current approach of lifestyle and dietary modifications. Monitor blood pressure at home periodically, and if readings consistently stay within target range, ongoing suspension of antihypertensive medication may be considered appropriate. Encourage healthy diet and regular exercise. If blood pressure increases, a reevaluation of the need for antihypertensive medication is warranted.

## 2022-09-07 NOTE — Progress Notes (Addendum)
Assessment: 1. BPH with obstruction/lower urinary tract symptoms   2. Elevated PSA     Plan: Free and total PSA today Continue tamsulosin Continue Myrbetriq 25 mg daily.  Samples and rx given. Return to office in 3 months  Chief Complaint:  Chief Complaint  Patient presents with   Benign Prostatic Hypertrophy    History of Present Illness:  Hector Matthews is a 72 y.o. male who is seen for continued evaluation of lower urinary tract symptoms and elevated PSA. At his visit in 12/23, he reported worsening of his lower urinary tract symptoms for several months.  He had urgency with occasional urge incontinence, nocturia x 1, intermittent stream, decreased force of stream, and sensation of incomplete emptying.  No dysuria or gross hematuria.  No history of UTIs.  He was started on tamsulosin in November 2023 and had not noted some improvement in his urinary symptoms. IPSS = 18. PVR = 74 ml.  PSA from 11/23: 5.10. No prior PSA results available for review.  He is not aware of a previously elevated PSA test.  No prior prostate biopsy.  No family history of prostate cancer.  He works as a Administrator.  He has a history of tobacco use smoking 1 pack/day for approximately 15 years.  He previously had quit for about 20 years.  He was given a trial of Myrbetriq 25 mg daily at his visit in 12/23.  He returns today for follow-up.  He continues on tamsulosin.  He has noted improvement in his urinary symptoms with Myrbetriq 25 mg daily.  He has had decreased urgency, decreased urge incontinence, and decreased nocturia.  No side effects from the medication.  No dysuria or gross hematuria. IPSS = 18 today.  Portions of the above documentation were copied from a prior visit for review purposes only.   Past Medical History:  Past Medical History:  Diagnosis Date   Allergy    Cirrhosis ([place])    Hepatitis C    Hyperlipidemia    Hypertension    Nonsustained ventricular tachycardia (HCC)     Asymptomatic, noted on Holter monitor   Premature ventricular contractions     Past Surgical History:  Past Surgical History:  Procedure Laterality Date   COLONOSCOPY     GANGLION CYST EXCISION Right    foot   POLYPECTOMY      Allergies:  Allergies  Allergen Reactions   Chantix [Varenicline] Other (See Comments)    Sedation    Family History:  Family History  Problem Relation Age of Onset   Pancreatic cancer Father    Cancer Father    Hypertension Brother        x 2   Colon cancer Neg Hx    Colon polyps Neg Hx    Esophageal cancer Neg Hx    Rectal cancer Neg Hx    Stomach cancer Neg Hx     Social History:  Social History   Tobacco Use   Smoking status: Every Day    Packs/day: 1.00    Years: 22.00    Total pack years: 22.00    Types: Cigarettes   Smokeless tobacco: Never  Vaping Use   Vaping Use: Never used  Substance Use Topics   Alcohol use: No   Drug use: No    ROS: Constitutional:  Negative for fever, chills, weight loss CV: Negative for chest pain, previous MI, hypertension Respiratory:  Negative for shortness of breath, wheezing, sleep apnea, frequent cough GI:  Negative for  nausea, vomiting, bloody stool, GERD  Physical exam: BP 135/86   Pulse (!) 48   Ht '5\' 8"'$  (1.727 m)   Wt 201 lb (91.2 kg)   BMI 30.56 kg/m  GENERAL APPEARANCE:  Well appearing, well developed, well nourished, NAD HEENT:  Atraumatic, normocephalic, oropharynx clear NECK:  Supple without lymphadenopathy or thyromegaly ABDOMEN:  Soft, non-tender, no masses EXTREMITIES:  Moves all extremities well, without clubbing, cyanosis, or edema NEUROLOGIC:  Alert and oriented x 3, normal gait, CN II-XII grossly intact MENTAL STATUS:  appropriate BACK:  Non-tender to palpation, No CVAT SKIN:  Warm, dry, and intact   Results: U/A dipstick:  trace blood

## 2022-09-08 LAB — PSA, TOTAL AND FREE
PSA, Free Pct: 25.7 %
PSA, Free: 0.72 ng/mL
Prostate Specific Ag, Serum: 2.8 ng/mL (ref 0.0–4.0)

## 2022-09-10 ENCOUNTER — Encounter: Payer: Self-pay | Admitting: Urology

## 2022-09-13 ENCOUNTER — Other Ambulatory Visit: Payer: Self-pay | Admitting: Family Medicine

## 2022-09-13 DIAGNOSIS — N138 Other obstructive and reflux uropathy: Secondary | ICD-10-CM

## 2022-10-12 ENCOUNTER — Other Ambulatory Visit: Payer: Self-pay | Admitting: Nurse Practitioner

## 2022-10-12 DIAGNOSIS — K760 Fatty (change of) liver, not elsewhere classified: Secondary | ICD-10-CM | POA: Diagnosis not present

## 2022-10-12 DIAGNOSIS — K7469 Other cirrhosis of liver: Secondary | ICD-10-CM | POA: Diagnosis not present

## 2022-10-12 DIAGNOSIS — I440 Atrioventricular block, first degree: Secondary | ICD-10-CM | POA: Diagnosis not present

## 2022-11-09 ENCOUNTER — Ambulatory Visit
Admission: RE | Admit: 2022-11-09 | Discharge: 2022-11-09 | Disposition: A | Discharge status: Home / Self Care | Payer: Medicare Other | Source: Ambulatory Visit | Attending: Nurse Practitioner | Admitting: Nurse Practitioner

## 2022-11-09 DIAGNOSIS — K760 Fatty (change of) liver, not elsewhere classified: Secondary | ICD-10-CM

## 2022-11-09 DIAGNOSIS — K7469 Other cirrhosis of liver: Secondary | ICD-10-CM

## 2022-12-14 ENCOUNTER — Ambulatory Visit: Payer: Medicare Other | Admitting: Urology

## 2022-12-28 ENCOUNTER — Encounter: Payer: Self-pay | Admitting: Urology

## 2022-12-28 ENCOUNTER — Ambulatory Visit (INDEPENDENT_AMBULATORY_CARE_PROVIDER_SITE_OTHER): Payer: Medicare Other | Admitting: Urology

## 2022-12-28 VITALS — BP 154/81 | HR 59 | Ht 68.0 in | Wt 195.0 lb

## 2022-12-28 DIAGNOSIS — N138 Other obstructive and reflux uropathy: Secondary | ICD-10-CM

## 2022-12-28 DIAGNOSIS — R972 Elevated prostate specific antigen [PSA]: Secondary | ICD-10-CM | POA: Diagnosis not present

## 2022-12-28 DIAGNOSIS — N401 Enlarged prostate with lower urinary tract symptoms: Secondary | ICD-10-CM | POA: Diagnosis not present

## 2022-12-28 DIAGNOSIS — R3129 Other microscopic hematuria: Secondary | ICD-10-CM | POA: Diagnosis not present

## 2022-12-28 LAB — URINALYSIS, ROUTINE W REFLEX MICROSCOPIC
Bilirubin, UA: NEGATIVE
Glucose, UA: NEGATIVE
Ketones, UA: NEGATIVE
Leukocytes,UA: NEGATIVE
Nitrite, UA: NEGATIVE
Protein,UA: NEGATIVE
Specific Gravity, UA: 1.025 (ref 1.005–1.030)
Urobilinogen, Ur: 0.2 mg/dL (ref 0.2–1.0)
pH, UA: 6 (ref 5.0–7.5)

## 2022-12-28 LAB — MICROSCOPIC EXAMINATION
Cast Type: NONE SEEN
Casts: NONE SEEN /lpf
Crystal Type: NONE SEEN
Crystals: NONE SEEN
Renal Epithel, UA: NONE SEEN /hpf
Trichomonas, UA: NONE SEEN
Yeast, UA: NONE SEEN

## 2022-12-28 MED ORDER — MIRABEGRON ER 25 MG PO TB24: 25.0000 mg | ORAL_TABLET | Freq: Every day | ORAL | 11 refills | Status: DC

## 2022-12-28 NOTE — Progress Notes (Signed)
Assessment: 1. BPH with obstruction/lower urinary tract symptoms   2. Elevated PSA   3. Microscopic hematuria     Plan: Continue tamsulosin Continue Myrbetriq 25 mg daily.  Rx given for GoodRx coupon. Repeat U/A in 3-4 weeks - will call with results  Chief Complaint:  Chief Complaint  Patient presents with   Benign Prostatic Hypertrophy    History of Present Illness:  Hector Matthews is a 72 y.o. male who is seen for continued evaluation of lower urinary tract symptoms and elevated PSA. At his visit in 12/23, he reported worsening of his lower urinary tract symptoms for several months.  He had urgency with occasional urge incontinence, nocturia x 1, intermittent stream, decreased force of stream, and sensation of incomplete emptying.  No dysuria or gross hematuria.  No history of UTIs.  He was started on tamsulosin in November 2023 and had not noted some improvement in his urinary symptoms. IPSS = 18. PVR = 74 ml.  PSA from 11/23: 5.10. No prior PSA results available for review.  He is not aware of a previously elevated PSA test.  No prior prostate biopsy.  No family history of prostate cancer.  He works as a Naval architect.  He has a history of tobacco use smoking 1 pack/day for approximately 15 years.  He previously had quit for about 20 years.  He was given a trial of Myrbetriq 25 mg daily at his visit in 12/23.  At his visit in 2/24, he continued on tamsulosin with improvement in his urinary symptoms with addition of Myrbetriq 25 mg daily.  He had decreased urgency, decreased urge incontinence, and decreased nocturia.  No side effects from the medication.  No dysuria or gross hematuria. IPSS = 18.  PSA 2/24:  2.8 with 25.7% free  He returns today for scheduled follow-up.  He continues on tamsulosin.  He ran out of Myrbetriq approximately 1 month ago and did not refill it due to the cost of the medication.  He has noted a increase in his lower urinary tract symptoms with  increased urgency and urge incontinence since stopping the Myrbetriq.  No dysuria or gross hematuria. IPSS = 18 today.  Portions of the above documentation were copied from a prior visit for review purposes only.   Past Medical History:  Past Medical History:  Diagnosis Date   Allergy    Cirrhosis (HCC)    Hepatitis C    Hyperlipidemia    Hypertension    Nonsustained ventricular tachycardia (HCC)    Asymptomatic, noted on Holter monitor   Premature ventricular contractions     Past Surgical History:  Past Surgical History:  Procedure Laterality Date   COLONOSCOPY     GANGLION CYST EXCISION Right    foot   POLYPECTOMY      Allergies:  Allergies  Allergen Reactions   Varenicline Other (See Comments)    Sedation  Sedation, "felt bad while taking it"    Family History:  Family History  Problem Relation Age of Onset   Pancreatic cancer Father    Cancer Father    Hypertension Brother        x 2   Colon cancer Neg Hx    Colon polyps Neg Hx    Esophageal cancer Neg Hx    Rectal cancer Neg Hx    Stomach cancer Neg Hx     Social History:  Social History   Tobacco Use   Smoking status: Every Day    Packs/day: 1.00  Years: 22.00    Additional pack years: 0.00    Total pack years: 22.00    Types: Cigarettes   Smokeless tobacco: Never  Vaping Use   Vaping Use: Never used  Substance Use Topics   Alcohol use: No   Drug use: No    ROS: Constitutional:  Negative for fever, chills, weight loss CV: Negative for chest pain, previous MI, hypertension Respiratory:  Negative for shortness of breath, wheezing, sleep apnea, frequent cough GI:  Negative for nausea, vomiting, bloody stool, GERD  Physical exam: BP (!) 154/81   Pulse (!) 59   Ht 5\' 8"  (1.727 m)   Wt 195 lb (88.5 kg)   BMI 29.65 kg/m  GENERAL APPEARANCE:  Well appearing, well developed, well nourished, NAD HEENT:  Atraumatic, normocephalic, oropharynx clear NECK:  Supple without lymphadenopathy  or thyromegaly ABDOMEN:  Soft, non-tender, no masses EXTREMITIES:  Moves all extremities well, without clubbing, cyanosis, or edema NEUROLOGIC:  Alert and oriented x 3, normal gait, CN II-XII grossly intact MENTAL STATUS:  appropriate BACK:  Non-tender to palpation, No CVAT SKIN:  Warm, dry, and intact   Results: U/A: 0-5 WBC, 3-10 RBC, mod bacteria

## 2023-01-01 ENCOUNTER — Other Ambulatory Visit: Payer: Self-pay | Admitting: Cardiology

## 2023-01-01 DIAGNOSIS — I4729 Other ventricular tachycardia: Secondary | ICD-10-CM

## 2023-01-24 ENCOUNTER — Other Ambulatory Visit: Payer: Self-pay

## 2023-01-24 DIAGNOSIS — R3129 Other microscopic hematuria: Secondary | ICD-10-CM

## 2023-01-25 ENCOUNTER — Other Ambulatory Visit: Payer: Medicare Other

## 2023-01-25 ENCOUNTER — Telehealth: Payer: Self-pay

## 2023-01-25 ENCOUNTER — Other Ambulatory Visit: Payer: Self-pay | Admitting: Urology

## 2023-01-25 DIAGNOSIS — R3129 Other microscopic hematuria: Secondary | ICD-10-CM

## 2023-01-25 LAB — MICROSCOPIC EXAMINATION
Cast Type: NONE SEEN
Casts: NONE SEEN /lpf
Crystal Type: NONE SEEN
Crystals: NONE SEEN
Renal Epithel, UA: NONE SEEN /hpf
Trichomonas, UA: NONE SEEN
Yeast, UA: NONE SEEN

## 2023-01-25 LAB — URINALYSIS, ROUTINE W REFLEX MICROSCOPIC
Bilirubin, UA: NEGATIVE
Glucose, UA: NEGATIVE
Ketones, UA: NEGATIVE
Nitrite, UA: NEGATIVE
Protein,UA: NEGATIVE
Specific Gravity, UA: 1.03 (ref 1.005–1.030)
Urobilinogen, Ur: 0.2 mg/dL (ref 0.2–1.0)
pH, UA: 5.5 (ref 5.0–7.5)

## 2023-01-25 NOTE — Telephone Encounter (Signed)
Ok per DPR, left detailed VM advising pt of results. Will send pt mychart msg as well.

## 2023-01-25 NOTE — Telephone Encounter (Signed)
-----   Message from Milderd Meager, MD sent at 01/25/2023 12:56 PM EDT ----- Please notify Hector Matthews that his repeat urinalysis still shows microscopic blood.   Recommend hematuria evaluation with CT and cystoscopy. I have placed the order for the CT scan and once this is scheduled we can arrange for cysto appt.

## 2023-02-15 ENCOUNTER — Ambulatory Visit (HOSPITAL_BASED_OUTPATIENT_CLINIC_OR_DEPARTMENT_OTHER)
Admission: RE | Admit: 2023-02-15 | Discharge: 2023-02-15 | Disposition: A | Discharge status: Home / Self Care | Payer: Medicare Other | Source: Ambulatory Visit | Attending: Urology | Admitting: Urology

## 2023-02-15 ENCOUNTER — Ambulatory Visit: Payer: Medicare Other | Admitting: Cardiology

## 2023-02-15 DIAGNOSIS — N401 Enlarged prostate with lower urinary tract symptoms: Secondary | ICD-10-CM | POA: Diagnosis not present

## 2023-02-15 DIAGNOSIS — R3129 Other microscopic hematuria: Secondary | ICD-10-CM | POA: Diagnosis not present

## 2023-02-15 DIAGNOSIS — K4020 Bilateral inguinal hernia, without obstruction or gangrene, not specified as recurrent: Secondary | ICD-10-CM | POA: Diagnosis not present

## 2023-02-15 DIAGNOSIS — K5710 Diverticulosis of small intestine without perforation or abscess without bleeding: Secondary | ICD-10-CM | POA: Diagnosis not present

## 2023-02-15 DIAGNOSIS — K7460 Unspecified cirrhosis of liver: Secondary | ICD-10-CM | POA: Diagnosis not present

## 2023-02-15 MED ORDER — IOHEXOL 300 MG/ML  SOLN
125.0000 mL | Freq: Once | INTRAMUSCULAR | Status: AC | PRN
  Administered 2023-02-15: 125 mL via INTRAVENOUS

## 2023-02-17 ENCOUNTER — Other Ambulatory Visit: Payer: Self-pay | Admitting: Family Medicine

## 2023-02-18 NOTE — Telephone Encounter (Signed)
Chart supports rx. Last OV: 09/07/2022

## 2023-03-04 ENCOUNTER — Ambulatory Visit: Payer: Medicare Other | Admitting: Cardiology

## 2023-03-04 ENCOUNTER — Encounter: Payer: Self-pay | Admitting: Cardiology

## 2023-03-04 VITALS — BP 134/84 | HR 64 | Resp 16 | Ht 68.0 in | Wt 205.8 lb

## 2023-03-04 DIAGNOSIS — I493 Ventricular premature depolarization: Secondary | ICD-10-CM | POA: Diagnosis not present

## 2023-03-04 DIAGNOSIS — R001 Bradycardia, unspecified: Secondary | ICD-10-CM | POA: Diagnosis not present

## 2023-03-04 DIAGNOSIS — F17200 Nicotine dependence, unspecified, uncomplicated: Secondary | ICD-10-CM

## 2023-03-04 DIAGNOSIS — E782 Mixed hyperlipidemia: Secondary | ICD-10-CM

## 2023-03-04 DIAGNOSIS — IMO0001 Reserved for inherently not codable concepts without codable children: Secondary | ICD-10-CM

## 2023-03-04 DIAGNOSIS — I10 Essential (primary) hypertension: Secondary | ICD-10-CM

## 2023-03-04 NOTE — Progress Notes (Signed)
ID:  Hector Matthews, DOB Oct 29, 1950, MRN [id number]  PCP:  Garnette Gunner, MD  Cardiologist:  Tessa Lerner, DO, [facility] (established care 02/16/2020)  Date: 03/04/23 Last Office Visit: 02/16/2022  Chief Complaint  Patient presents with   Bradycardia   Follow-up    1 year   HPI  Hector Matthews is a 72 y.o. male whose past medical history and cardiovascular risk factors include: Benign essential hypertension, tobacco use, hyperlipidemia, first-degree AV block, history of hepatitis C status posttreatment, cirrhosis, advanced age.  Patient was referred to the practice for evaluation of bradycardia at the request of his hepatologist.  He has undergone cardiac monitor and results noted below.  He is also undergone ischemic workup in the past.  He presents today for 1 year follow-up visit.  Over the last 1 year he denies anginal chest pain or heart failure symptoms.  He is still busy driving trucks.  Denies lightheaded dizziness or passing out.  He has been noticing some hematuria and has a urology procedure tomorrow for further evaluation.  He continues to smoke 1 pack/day.  He understands the importance of complete cessation.  Labs from November 2023 independently reviewed.  He has yearly physical coming up in August 2024.  FUNCTIONAL STATUS: No structured exercise program or daily routine.   ALLERGIES: Allergies  Allergen Reactions   Varenicline Other (See Comments)    Sedation  Sedation, "felt bad while taking it"    MEDICATION LIST PRIOR TO VISIT: Current Meds  Medication Sig   albuterol (VENTOLIN HFA) 108 (90 Base) MCG/ACT inhaler Inhale 2 puffs into the lungs every 6 (six) hours as needed for wheezing or shortness of breath.   atorvastatin (LIPITOR) 10 MG tablet TAKE ONE TABLET BY MOUTH ONE TIME DAILY   ibuprofen (ADVIL,MOTRIN) 200 MG tablet Take 200 mg by mouth every 6 (six) hours as needed.   Iron, Ferrous Sulfate, 325 (65 Fe) MG TABS Take 325 mg by mouth daily.    lisinopril-hydrochlorothiazide (ZESTORETIC) 10-12.5 MG tablet TAKE ONE TABLET BY MOUTH ONE TIME DAILY   metoprolol succinate (TOPROL-XL) 25 MG 24 hr tablet TAKE ONE TABLET BY MOUTH EVERY MORNING   mirabegron ER (MYRBETRIQ) 25 MG TB24 tablet Take 1 tablet (25 mg total) by mouth daily.   tamsulosin (FLOMAX) 0.4 MG CAPS capsule Take 1 capsule (0.4 mg total) by mouth daily.     PAST MEDICAL HISTORY: Past Medical History:  Diagnosis Date   Allergy    Cirrhosis (HCC)    Hepatitis C    Hyperlipidemia    Hypertension    Nonsustained ventricular tachycardia (HCC)    Asymptomatic, noted on Holter monitor   Premature ventricular contractions     PAST SURGICAL HISTORY: Past Surgical History:  Procedure Laterality Date   COLONOSCOPY     GANGLION CYST EXCISION Right    foot   POLYPECTOMY      FAMILY HISTORY: The patient family history includes Cancer in his father; Hypertension in his brother; Pancreatic cancer in his father.  SOCIAL HISTORY:  The patient  reports that he has been smoking cigarettes. He has a 22 pack-year smoking history. He has never used smokeless tobacco. He reports that he does not drink alcohol and does not use drugs.  REVIEW OF SYSTEMS: Review of Systems  Cardiovascular:  Negative for chest pain, claudication, dyspnea on exertion, irregular heartbeat, leg swelling, near-syncope, orthopnea, palpitations, paroxysmal nocturnal dyspnea and syncope.  Respiratory:  Negative for shortness of breath.   Hematologic/Lymphatic: Negative for bleeding  problem.  Musculoskeletal:  Negative for muscle cramps and myalgias.  Neurological:  Negative for dizziness and light-headedness.    PHYSICAL EXAM:    03/04/2023    2:47 PM 12/28/2022    9:47 AM 09/07/2022    9:48 AM  Vitals with BMI  Height 5\' 8"  5\' 8"  5\' 8"   Weight 205 lbs 13 oz 195 lbs 201 lbs  BMI 31.3 29.66 30.57  Systolic 134 154 161  Diastolic 84 81 86  Pulse 64 59 48   Physical Exam  Constitutional: No distress.   Age appropriate, hemodynamically stable.   Neck: No JVD present.  Cardiovascular: Normal rate, regular rhythm, S1 normal, S2 normal, intact distal pulses and normal pulses. Exam reveals no gallop, no S3 and no S4.  No murmur heard. Pulmonary/Chest: Effort normal and breath sounds normal. No stridor. He has no wheezes. He has no rales.  Abdominal: Soft. Bowel sounds are normal. He exhibits no distension. There is no abdominal tenderness.  Musculoskeletal:        General: No edema.     Cervical back: Neck supple.  Neurological: He is alert and oriented to person, place, and time. He has intact cranial nerves (2-12).  Skin: Skin is warm and moist.   CARDIAC DATABASE: EKG: March 04, 2023: Sinus rhythm, 69 bpm, first-degree AV block, low voltage, PVCs.  Echocardiogram: 02/16/2021: Left ventricle cavity is normal in size and wall thickness. Normal global wall motion. Normal LV systolic function with EF 63%. Doppler evidence of grade I (impaired) diastolic dysfunction, normal LAP. Aneurysmal interatrial septum without 2D or color Doppler evidence of shunting. Moderate (Grade II) mitral regurgitation. Normal right atrial pressure.    Stress Testing: Lexiscan Tetrofosmin stress test 03/06/2021: Lexiscan nuclear stress test performed using 1-day protocol. Normal wall motion and thickening. Stress LVEF 50%. SPECT images show moderate sized area of decreased tracer uptake, both at rest and stress. In absence of wall motion abnormalities, this likely represents gut attenuation. Ischemic in this region cannot be excluded. Recommended clinical correlation. Low risk study.  Heart Catheterization: None  14 day extended Holter monitor: Dominant rhythm normal sinus. Burden of sinus bradycardia approximately 5% Heart rate 41-214 bpm.  Avg HR 63 bpm. No atrial fibrillation, high grade AV block, pauses (3 seconds or longer). Total ventricular ectopic burden <1%. Isolated ventricular ectopic beats 1351  with a longest ventricular trigeminy episode approximately 25 seconds. 3 episodes of ventricular tachycardia: The fastest lasting 4 beats at a maximal rate of 214 bpm and the longest episode lasting 6 beats at an average of 136 bpm. Total supraventricular ectopic burden <1%.  The fastest episode of SVT was 5 beats in duration at 190 bpm. Patient triggered events: 0.  LABORATORY DATA:    Latest Ref Rng & Units 06/15/2022    8:23 AM 04/13/2022   12:00 AM  CBC  WBC 4.0 - 10.5 K/uL 7.7  8.4      Hemoglobin 13.0 - 17.0 g/dL 09.6  04.5      Hematocrit 39.0 - 52.0 % 38.5  31      Platelets 150.0 - 400.0 K/uL 230.0  305         This result is from an external source.       Latest Ref Rng & Units 06/15/2022    8:23 AM 04/13/2022   12:00 AM 06/19/2021   12:00 AM  CMP  Glucose 70 - 99 mg/dL 98     BUN 6 - 23 mg/dL 19   16  Creatinine 0.40 - 1.50 mg/dL 0.98  1.1     1.2      Sodium 135 - 145 mEq/L 137  140     139      Potassium 3.5 - 5.1 mEq/L 4.1   4.8      Chloride 96 - 112 mEq/L 106   105      CO2 19 - 32 mEq/L 25   30      Calcium 8.4 - 10.5 mg/dL 8.7   9.1      Total Protein 6.0 - 8.3 g/dL 6.8     Total Bilirubin 0.2 - 1.2 mg/dL 0.4     Alkaline Phos 39 - 117 U/L 45  50       AST 0 - 37 U/L 16  17     20       ALT 0 - 53 U/L 10  9     14          This result is from an external source.    Lipid Panel     Component Value Date/Time   CHOL 109 06/15/2022 0823   TRIG 92.0 06/15/2022 0823   HDL 33.40 (L) 06/15/2022 0823   CHOLHDL 3 06/15/2022 0823   VLDL 18.4 06/15/2022 0823   LDLCALC 57 06/15/2022 0823    No components found for: "NTPROBNP" No results for input(s): "PROBNP" in the last 8760 hours. Recent Labs    06/15/22 0823  TSH 2.05    BMP Recent Labs    04/13/22 0000 06/15/22 0823  NA 140 137  K  --  4.1  CL  --  106  CO2  --  25  GLUCOSE  --  98  BUN  --  19  CREATININE 1.1 1.34  CALCIUM  --  8.7     HEMOGLOBIN A1C No results found for:  "HGBA1C", "MPG"  External Labs: 12/12/2020: Hemoglobin 14.1 g/dL, hematocrit 11.9% Sodium 138, potassium 4.3, chloride 104, bicarb 29, BUN 14, creatinine 1.31 mg/dL. AST 18, ALT 11, alkaline phosphatase 52 Total cholesterol 106, triglycerides 57, HDL 35, LDL 58, non-HDL 71  02/10/2021: TSH 1.9  IMPRESSION:    ICD-10-CM   1. Bradycardia  R00.1 EKG 12-Lead    PCV ECHOCARDIOGRAM COMPLETE    2. PVC's (premature ventricular contractions)  I49.3 PCV ECHOCARDIOGRAM COMPLETE    3. Benign hypertension  I10 PCV ECHOCARDIOGRAM COMPLETE    4. Mixed hyperlipidemia  E78.2     5. Smoking  F17.200        RECOMMENDATIONS: Hector Matthews is a 72 y.o. male whose past medical history and cardiac risk factors include: Benign essential hypertension, tobacco use, hyperlipidemia, first-degree AV block, history of hepatitis C status posttreatment, cirrhosis, advanced age.  Bradycardia Intermittent, asymptomatic. EKG today illustrates sinus rhythm with rare PVCs. Prior echo and stress test results reviewed as part of medical decision making today. No identifiable reversible cause. Monitor for now.  PVC's (premature ventricular contractions) Asymptomatic. Continue Toprol-XL. Monitor for now  Benign hypertension Office blood pressures are well-controlled. Continue current medical therapy. No changes warranted at this time  Mixed hyperlipidemia Currently on atorvastatin.   He denies myalgia or other side effects. LDL currently within acceptable limits. Currently managed by primary care provider.  Smoking Tobacco cessation counseling: Currently smoking 1 packs/day   Patient was informed of the dangers of tobacco abuse including stroke, cancer, and MI, as well as benefits of tobacco cessation. Patient is willing to quit at this time. 7 mins were spent  counseling patient cessation techniques. We discussed various methods to help quit smoking, including deciding on a date to quit, joining a  support group, pharmacological agents- nicotine gum/patch/lozenges.  I will reassess his progress at the next follow-up visit. Not sure if he had a AAA screening given the degree of smoking -patient states that he will discuss it further with PCP.  Most recent lung cancer screening noted aortic and coronary artery calcification.Continue statin therapy, LDL within acceptable limits, has undergone echo and stress test in the past.  Will hold off on additional testing as he is currently asymptomatic.  Reemphasized the importance of secondary prevention.  Patient is scheduled for cystoscopy tomorrow 03/05/2023.  FINAL MEDICATION LIST END OF ENCOUNTER: No orders of the defined types were placed in this encounter.    There are no discontinued medications.     Current Outpatient Medications:    albuterol (VENTOLIN HFA) 108 (90 Base) MCG/ACT inhaler, Inhale 2 puffs into the lungs every 6 (six) hours as needed for wheezing or shortness of breath., Disp: 8 g, Rfl: 0   atorvastatin (LIPITOR) 10 MG tablet, TAKE ONE TABLET BY MOUTH ONE TIME DAILY, Disp: 90 tablet, Rfl: 1   ibuprofen (ADVIL,MOTRIN) 200 MG tablet, Take 200 mg by mouth every 6 (six) hours as needed., Disp: , Rfl:    Iron, Ferrous Sulfate, 325 (65 Fe) MG TABS, Take 325 mg by mouth daily., Disp: 90 tablet, Rfl: 3   lisinopril-hydrochlorothiazide (ZESTORETIC) 10-12.5 MG tablet, TAKE ONE TABLET BY MOUTH ONE TIME DAILY, Disp: 90 tablet, Rfl: 1   metoprolol succinate (TOPROL-XL) 25 MG 24 hr tablet, TAKE ONE TABLET BY MOUTH EVERY MORNING, Disp: 180 tablet, Rfl: 0   mirabegron ER (MYRBETRIQ) 25 MG TB24 tablet, Take 1 tablet (25 mg total) by mouth daily., Disp: 30 tablet, Rfl: 11   tamsulosin (FLOMAX) 0.4 MG CAPS capsule, Take 1 capsule (0.4 mg total) by mouth daily., Disp: 30 capsule, Rfl: 11  Orders Placed This Encounter  Procedures   EKG 12-Lead   PCV ECHOCARDIOGRAM COMPLETE   There are no Patient Instructions on file for this visit.    --Continue cardiac medications as reconciled in final medication list. --Return in about 1 year (around 03/03/2024) for Annual follow up visit Bradycardia. Or sooner if needed. --Continue follow-up with your primary care physician regarding the management of your other chronic comorbid conditions.  Patient's questions and concerns were addressed to his satisfaction. He voices understanding of the instructions provided during this encounter.   This note was created using a voice recognition software as a result there may be grammatical errors inadvertently enclosed that do not reflect the nature of this encounter. Every attempt is made to correct such errors.  Tessa Lerner, [place], [facility]  Pager:  [phone] Office: [phone]

## 2023-03-05 ENCOUNTER — Ambulatory Visit: Payer: Medicare Other | Admitting: Urology

## 2023-03-05 ENCOUNTER — Encounter: Payer: Self-pay | Admitting: Urology

## 2023-03-05 VITALS — BP 137/85 | HR 49 | Ht 68.0 in | Wt 203.0 lb

## 2023-03-05 DIAGNOSIS — R3129 Other microscopic hematuria: Secondary | ICD-10-CM | POA: Diagnosis not present

## 2023-03-05 DIAGNOSIS — N401 Enlarged prostate with lower urinary tract symptoms: Secondary | ICD-10-CM

## 2023-03-05 DIAGNOSIS — N138 Other obstructive and reflux uropathy: Secondary | ICD-10-CM

## 2023-03-05 MED ORDER — CIPROFLOXACIN HCL 500 MG PO TABS
500.0000 mg | ORAL_TABLET | Freq: Once | ORAL | Status: AC
Start: 2023-03-05 — End: 2023-03-05
  Administered 2023-03-05: 500 mg via ORAL

## 2023-03-05 MED ORDER — FINASTERIDE 5 MG PO TABS: 5.0000 mg | ORAL_TABLET | Freq: Every day | ORAL | 11 refills | Status: DC

## 2023-03-05 NOTE — Progress Notes (Signed)
Assessment: 1. Microscopic hematuria   2. BPH with obstruction/lower urinary tract symptoms     Plan: Continue tamsulosin I personally reviewed the CT study from 7/24 with results as noted below. Cipro x 1 following cystoscopy I discussed the findings on cystoscopy with [name] today. No evidence of any serious or life-threatening cause for the microscopic hematuria.  Recommend close follow-up of his urinalysis in the future. I also discussed options for management of his BPH with lower urinary tract symptoms.  I discussed management with alpha blockers, combination therapy with addition of a 5 alpha reductase inhibitor, minimally invasive procedures, and surgical management with TURP, HoLEP, or simple prostatectomy. At the present time he would like to continue management.  He is interested in the addition of a 5 alpha reductase inhibitor such as finasteride.  Potential side effects discussed. Finasteride 5 mg daily.  Prescription sent. Return to office in 6 months.   Chief Complaint:  Chief Complaint  Patient presents with   Cysto    History of Present Illness:  Hector Matthews is a 72 y.o. male who is seen for continued evaluation of microscopic hematuria, lower urinary tract symptoms and a history of elevated PSA. At his visit in 12/23, he reported worsening of his lower urinary tract symptoms for several months.  He had urgency with occasional urge incontinence, nocturia x 1, intermittent stream, decreased force of stream, and sensation of incomplete emptying.  No dysuria or gross hematuria.  No history of UTIs.  He was started on tamsulosin in November 2023 and had not noted some improvement in his urinary symptoms. IPSS = 18. PVR = 74 ml.  PSA from 11/23: 5.10. No prior PSA results available for review.  He is not aware of a previously elevated PSA test.  No prior prostate biopsy.  No family history of prostate cancer.  He works as a Naval architect.  He has a history of  tobacco use smoking 1 pack/day for approximately 15 years.  He previously had quit for about 20 years.  He was given a trial of Myrbetriq 25 mg daily at his visit in 12/23.  At his visit in 2/24, he continued on tamsulosin with improvement in his urinary symptoms with addition of Myrbetriq 25 mg daily.  He had decreased urgency, decreased urge incontinence, and decreased nocturia.  No side effects from the medication.  No dysuria or gross hematuria. IPSS = 18.  PSA 2/24:  2.8 with 25.7% free  At his visit in 5/24, he continued on tamsulosin.  He ran out of Myrbetriq approximately 1 month prior and did not refill it due to the cost of the medication.  He noted a increase in his lower urinary tract symptoms with increased urgency and urge incontinence since stopping the Myrbetriq.  No dysuria or gross hematuria. IPSS = 18. He was restarted on Myrbetriq 25 mg daily.   His U/A showed 3-10 RBCs. Repeat U/A from 6/24 showed 3-10 RBCs. CT hematuria study from 02/20/23 showed a subcentimeter low continue lesion in the lower pole of the right kidney too small to characterize, no renal or ureteral calculi, no obvious filling defects or evidence of obstruction, enlarged prostate with indentation of the bladder. Prostate volume is approximately 83 mL based on the CT.  He presents today for cystoscopy. He continues with urgency, intermittent stream, weak stream, and occasional urge incontinence.  No dysuria or gross hematuria.  He continues on tamsulosin.  He is not taking Myrbetriq.   IPSS = 24  QOL = 5 today  Portions of the above documentation were copied from a prior visit for review purposes only.   Past Medical History:  Past Medical History:  Diagnosis Date   Allergy    Cirrhosis (HCC)    Hepatitis C    Hyperlipidemia    Hypertension    Nonsustained ventricular tachycardia (HCC)    Asymptomatic, noted on Holter monitor   Premature ventricular contractions     Past Surgical History:   Past Surgical History:  Procedure Laterality Date   COLONOSCOPY     GANGLION CYST EXCISION Right    foot   POLYPECTOMY      Allergies:  Allergies  Allergen Reactions   Varenicline Other (See Comments)    Sedation  Sedation, "felt bad while taking it"    Family History:  Family History  Problem Relation Age of Onset   Pancreatic cancer Father    Cancer Father    Hypertension Brother        x 2   Colon cancer Neg Hx    Colon polyps Neg Hx    Esophageal cancer Neg Hx    Rectal cancer Neg Hx    Stomach cancer Neg Hx     Social History:  Social History   Tobacco Use   Smoking status: Every Day    Current packs/day: 1.00    Average packs/day: 1 pack/day for 22.0 years (22.0 ttl pk-yrs)    Types: Cigarettes   Smokeless tobacco: Never  Vaping Use   Vaping status: Never Used  Substance Use Topics   Alcohol use: No   Drug use: No    ROS: Constitutional:  Negative for fever, chills, weight loss CV: Negative for chest pain, previous MI, hypertension Respiratory:  Negative for shortness of breath, wheezing, sleep apnea, frequent cough GI:  Negative for nausea, vomiting, bloody stool, GERD  Physical exam: BP 137/85   Pulse (!) 49   Ht 5\' 8"  (1.727 m)   Wt 203 lb (92.1 kg)   BMI 30.87 kg/m  GENERAL APPEARANCE:  Well appearing, well developed, well nourished, NAD HEENT:  Atraumatic, normocephalic, oropharynx clear NECK:  Supple without lymphadenopathy or thyromegaly ABDOMEN:  Soft, non-tender, no masses EXTREMITIES:  Moves all extremities well, without clubbing, cyanosis, or edema NEUROLOGIC:  Alert and oriented x 3, normal gait, CN II-XII grossly intact MENTAL STATUS:  appropriate BACK:  Non-tender to palpation, No CVAT SKIN:  Warm, dry, and intact   Results: U/A: 3-10 RBCs  Procedure:  Flexible Cystourethroscopy  Pre-operative Diagnosis: Microscopic hematuria  Post-operative Diagnosis: Microscopic hematuria  Anesthesia:  local with lidocaine  jelly  Surgical Narrative:  After appropriate informed consent was obtained, the patient was prepped and draped in the usual sterile fashion in the supine position.  The patient was correctly identified and the proper procedure delineated prior to proceeding.  Sterile lidocaine gel was instilled in the urethra. The flexible cystoscope was introduced without difficulty.  Findings:  Anterior urethra: Normal  Posterior urethra:  lateral lobe enlargement with small median lobe and intravesical component  Bladder: Trabeculations; no papillary lesions seen  Ureteral orifices: normal  Additional findings: none  Saline bladder wash for cytology was not performed.    The cystoscope was then removed.  The patient tolerated the procedure well.

## 2023-03-06 ENCOUNTER — Encounter (INDEPENDENT_AMBULATORY_CARE_PROVIDER_SITE_OTHER): Payer: Self-pay

## 2023-03-06 LAB — MICROSCOPIC EXAMINATION

## 2023-03-22 ENCOUNTER — Ambulatory Visit: Payer: Medicare Other

## 2023-03-22 DIAGNOSIS — Z0000 Encounter for general adult medical examination without abnormal findings: Secondary | ICD-10-CM | POA: Diagnosis not present

## 2023-03-22 NOTE — Patient Instructions (Signed)
[name] , Thank you for taking time to come for your Medicare Wellness Visit. I appreciate your ongoing commitment to your health goals. Please review the following plan we discussed and let me know if I can assist you in the future.   Referrals/Orders/Follow-Ups/Clinician Recommendations: none  This is a list of the screening recommended for you and due dates:  Health Maintenance  Topic Date Due   COVID-19 Vaccine (1) Never done   Zoster (Shingles) Vaccine (1 of 2) Never done   Pneumonia Vaccine (2 of 2 - PPSV23 or PCV20) 10/29/2019   Flu Shot  03/07/2023   Screening for Lung Cancer  08/04/2023   Colon Cancer Screening  03/18/2024   Medicare Annual Wellness Visit  03/21/2024   DTaP/Tdap/Td vaccine (6 - Td or Tdap) 04/19/2024   Hepatitis C Screening  Completed   HPV Vaccine  Aged Out    Advanced directives: (ACP Link)Information on Advanced Care Planning can be found at [facility] [place] Advance Health Care Directives Advance Health Care Directives ([email or web address]   Next Medicare Annual Wellness Visit scheduled for next year: Yes  Preventive Care 65 Years and Older, Male  Preventive care refers to lifestyle choices and visits with your health care provider that can promote health and wellness. What does preventive care include? A yearly physical exam. This is also called an annual well check. Dental exams once or twice a year. Routine eye exams. Ask your health care provider how often you should have your eyes checked. Personal lifestyle choices, including: Daily care of your teeth and gums. Regular physical activity. Eating a healthy diet. Avoiding tobacco and drug use. Limiting alcohol use. Practicing safe sex. Taking low doses of aspirin every day. Taking vitamin and mineral supplements as recommended by your health care provider. What happens during an annual well check? The services and screenings done by your health care provider during your annual well  check will depend on your age, overall health, lifestyle risk factors, and family history of disease. Counseling  Your health care provider may ask you questions about your: Alcohol use. Tobacco use. Drug use. Emotional well-being. Home and relationship well-being. Sexual activity. Eating habits. History of falls. Memory and ability to understand (cognition). Work and work Astronomer. Screening  You may have the following tests or measurements: Height, weight, and BMI. Blood pressure. Lipid and cholesterol levels. These may be checked every 5 years, or more frequently if you are over 25 years old. Skin check. Lung cancer screening. You may have this screening every year starting at age 15 if you have a 30-pack-year history of smoking and currently smoke or have quit within the past 15 years. Fecal occult blood test (FOBT) of the stool. You may have this test every year starting at age 49. Flexible sigmoidoscopy or colonoscopy. You may have a sigmoidoscopy every 5 years or a colonoscopy every 10 years starting at age 53. Prostate cancer screening. Recommendations will vary depending on your family history and other risks. Hepatitis C blood test. Hepatitis B blood test. Sexually transmitted disease (STD) testing. Diabetes screening. This is done by checking your blood sugar (glucose) after you have not eaten for a while (fasting). You may have this done every 1-3 years. Abdominal aortic aneurysm (AAA) screening. You may need this if you are a current or former smoker. Osteoporosis. You may be screened starting at age 45 if you are at high risk. Talk with your health care provider about your test results, treatment options, and  if necessary, the need for more tests. Vaccines  Your health care provider may recommend certain vaccines, such as: Influenza vaccine. This is recommended every year. Tetanus, diphtheria, and acellular pertussis (Tdap, Td) vaccine. You may need a Td booster  every 10 years. Zoster vaccine. You may need this after age 49. Pneumococcal 13-valent conjugate (PCV13) vaccine. One dose is recommended after age 41. Pneumococcal polysaccharide (PPSV23) vaccine. One dose is recommended after age 80. Talk to your health care provider about which screenings and vaccines you need and how often you need them. This information is not intended to replace advice given to you by your health care provider. Make sure you discuss any questions you have with your health care provider. Document Released: 08/19/2015 Document Revised: 04/11/2016 Document Reviewed: 05/24/2015 Elsevier Interactive Patient Education  2017 [employer].  Fall Prevention in the Home Falls can cause injuries. They can happen to people of all ages. There are many things you can do to make your home safe and to help prevent falls. What can I do on the outside of my home? Regularly fix the edges of walkways and driveways and fix any cracks. Remove anything that might make you trip as you walk through a door, such as a raised step or threshold. Trim any bushes or trees on the path to your home. Use bright outdoor lighting. Clear any walking paths of anything that might make someone trip, such as rocks or tools. Regularly check to see if handrails are loose or broken. Make sure that both sides of any steps have handrails. Any raised decks and porches should have guardrails on the edges. Have any leaves, snow, or ice cleared regularly. Use sand or salt on walking paths during winter. Clean up any spills in your garage right away. This includes oil or grease spills. What can I do in the bathroom? Use night lights. Install grab bars by the toilet and in the tub and shower. Do not use towel bars as grab bars. Use non-skid mats or decals in the tub or shower. If you need to sit down in the shower, use a plastic, non-slip stool. Keep the floor dry. Clean up any water that spills on the floor as soon  as it happens. Remove soap buildup in the tub or shower regularly. Attach bath mats securely with double-sided non-slip rug tape. Do not have throw rugs and other things on the floor that can make you trip. What can I do in the bedroom? Use night lights. Make sure that you have a light by your bed that is easy to reach. Do not use any sheets or blankets that are too big for your bed. They should not hang down onto the floor. Have a firm chair that has side arms. You can use this for support while you get dressed. Do not have throw rugs and other things on the floor that can make you trip. What can I do in the kitchen? Clean up any spills right away. Avoid walking on wet floors. Keep items that you use a lot in easy-to-reach places. If you need to reach something above you, use a strong step stool that has a grab bar. Keep electrical cords out of the way. Do not use floor polish or wax that makes floors slippery. If you must use wax, use non-skid floor wax. Do not have throw rugs and other things on the floor that can make you trip. What can I do with my stairs? Do not leave any  items on the stairs. Make sure that there are handrails on both sides of the stairs and use them. Fix handrails that are broken or loose. Make sure that handrails are as long as the stairways. Check any carpeting to make sure that it is firmly attached to the stairs. Fix any carpet that is loose or worn. Avoid having throw rugs at the top or bottom of the stairs. If you do have throw rugs, attach them to the floor with carpet tape. Make sure that you have a light switch at the top of the stairs and the bottom of the stairs. If you do not have them, ask someone to add them for you. What else can I do to help prevent falls? Wear shoes that: Do not have high heels. Have rubber bottoms. Are comfortable and fit you well. Are closed at the toe. Do not wear sandals. If you use a stepladder: Make sure that it is fully  opened. Do not climb a closed stepladder. Make sure that both sides of the stepladder are locked into place. Ask someone to hold it for you, if possible. Clearly mark and make sure that you can see: Any grab bars or handrails. First and last steps. Where the edge of each step is. Use tools that help you move around (mobility aids) if they are needed. These include: Canes. Walkers. Scooters. Crutches. Turn on the lights when you go into a dark area. Replace any light bulbs as soon as they burn out. Set up your furniture so you have a clear path. Avoid moving your furniture around. If any of your floors are uneven, fix them. If there are any pets around you, be aware of where they are. Review your medicines with your doctor. Some medicines can make you feel dizzy. This can increase your chance of falling. Ask your doctor what other things that you can do to help prevent falls. This information is not intended to replace advice given to you by your health care provider. Make sure you discuss any questions you have with your health care provider. Document Released: 05/19/2009 Document Revised: 12/29/2015 Document Reviewed: 08/27/2014 Elsevier Interactive Patient Education  2017 [employer].

## 2023-03-22 NOTE — Progress Notes (Signed)
Subjective:   Hector Matthews is a 72 y.o. male who presents for Medicare Annual/Subsequent preventive examination.  Visit Complete: Virtual  I connected with  Norlene Duel on 03/22/23 by a audio enabled telemedicine application and verified that I am speaking with the correct person using two identifiers.  Patient Location: Home  Provider Location: Office/Clinic  I discussed the limitations of evaluation and management by telemedicine. The patient expressed understanding and agreed to proceed.  Vital Signs: Unable to obtain new vitals due to this being a telehealth visit.  Review of Systems     Cardiac Risk Factors include: advanced age (>25men, >36 women);dyslipidemia;hypertension;male gender     Objective:    Today's Vitals   There is no height or weight on file to calculate BMI.     03/22/2023    9:45 AM 03/20/2022    8:55 AM  Advanced Directives  Does Patient Have a Medical Advance Directive? No No;Yes  Type of Special educational needs teacher of [place];Living will  Copy of Healthcare Power of Attorney in Chart?  No - copy requested    Current Medications (verified) Outpatient Encounter Medications as of 03/22/2023  Medication Sig   albuterol (VENTOLIN HFA) 108 (90 Base) MCG/ACT inhaler Inhale 2 puffs into the lungs every 6 (six) hours as needed for wheezing or shortness of breath.   atorvastatin (LIPITOR) 10 MG tablet TAKE ONE TABLET BY MOUTH ONE TIME DAILY   finasteride (PROSCAR) 5 MG tablet Take 1 tablet (5 mg total) by mouth daily.   ibuprofen (ADVIL,MOTRIN) 200 MG tablet Take 200 mg by mouth every 6 (six) hours as needed.   Iron, Ferrous Sulfate, 325 (65 Fe) MG TABS Take 325 mg by mouth daily.   lisinopril-hydrochlorothiazide (ZESTORETIC) 10-12.5 MG tablet TAKE ONE TABLET BY MOUTH ONE TIME DAILY   metoprolol succinate (TOPROL-XL) 25 MG 24 hr tablet TAKE ONE TABLET BY MOUTH EVERY MORNING   tamsulosin (FLOMAX) 0.4 MG CAPS capsule Take 1 capsule (0.4 mg total)  by mouth daily.   No facility-administered encounter medications on file as of 03/22/2023.    Allergies (verified) Varenicline   History: Past Medical History:  Diagnosis Date   Allergy    Cirrhosis (HCC)    Hepatitis C    Hyperlipidemia    Hypertension    Nonsustained ventricular tachycardia (HCC)    Asymptomatic, noted on Holter monitor   Premature ventricular contractions    Past Surgical History:  Procedure Laterality Date   COLONOSCOPY     GANGLION CYST EXCISION Right    foot   POLYPECTOMY     Family History  Problem Relation Age of Onset   Pancreatic cancer Father    Cancer Father    Hypertension Brother        x 2   Colon cancer Neg Hx    Colon polyps Neg Hx    Esophageal cancer Neg Hx    Rectal cancer Neg Hx    Stomach cancer Neg Hx    Social History   Socioeconomic History   Marital status: Married    Spouse name: Not on file   Number of children: 0   Years of education: Not on file   Highest education level: Not on file  Occupational History   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  Tobacco Use   Smoking status: Every Day    Current packs/day: 1.00    Average packs/day: 1 pack/day for 22.0 years (22.0 ttl pk-yrs)    Types: Cigarettes   Smokeless tobacco:  Never  Vaping Use   Vaping status: Never Used  Substance and Sexual Activity   Alcohol use: No   Drug use: No   Sexual activity: Not on file  Other Topics Concern   Not on file  Social History Narrative   Not on file   Social Determinants of Health   Financial Resource Strain: Low Risk  (03/22/2023)   Overall Financial Resource Strain (CARDIA)    Difficulty of Paying Living Expenses: Not hard at all  Food Insecurity: No Food Insecurity (03/22/2023)   Hunger Vital Sign    Worried About Running Out of Food in the Last Year: Never true    Ran Out of Food in the Last Year: Never true  Transportation Needs: No Transportation Needs (03/22/2023)   PRAPARE - Scientist, research (physical sciences) (Medical): No    Lack of Transportation (Non-Medical): No  Physical Activity: Inactive (03/22/2023)   Exercise Vital Sign    Days of Exercise per Week: 0 days    Minutes of Exercise per Session: 0 min  Stress: No Stress Concern Present (03/22/2023)   [employer] of Occupational Health - Occupational Stress Questionnaire    Feeling of Stress : Not at all  Social Connections: Moderately Isolated (03/22/2023)   Social Connection and Isolation Panel [NHANES]    Frequency of Communication with Friends and Family: More than three times a week    Frequency of Social Gatherings with Friends and Family: Once a week    Attends Religious Services: Never    Database administrator or Organizations: No    Attends Engineer, structural: Never    Marital Status: Married    Tobacco Counseling Ready to quit: Not Answered Counseling given: Not Answered   Clinical Intake:  Pre-visit preparation completed: Yes  Pain : No/denies pain     Nutritional Risks: None Diabetes: No  How often do you need to have someone help you when you read instructions, pamphlets, or other written materials from your doctor or pharmacy?: 1 - Never  Interpreter Needed?: No  Information entered by :: NAllen LPN   Activities of Daily Living    03/22/2023    9:42 AM  In your present state of health, do you have any difficulty performing the following activities:  Hearing? 0  Vision? 0  Difficulty concentrating or making decisions? 0  Walking or climbing stairs? 0  Dressing or bathing? 0  Doing errands, shopping? 0  Preparing Food and eating ? N  Using the Toilet? N  In the past six months, have you accidently leaked urine? N  Do you have problems with loss of bowel control? N  Managing your Medications? N  Managing your Finances? N  Housekeeping or managing your Housekeeping? N    Patient Care Team: Garnette Gunner, MD as PCP - General (Family Medicine)  Indicate any  recent Medical Services you may have received from other than [facility] providers in the past year (date may be approximate).     Assessment:   This is a routine wellness examination for Hector Matthews.  Hearing/Vision screen Hearing Screening - Comments:: Has ringing in ears Vision Screening - Comments:: Regular eye exams, [facility]  Dietary issues and exercise activities discussed:     Goals Addressed             This Visit's Progress    Patient Stated       03/22/2023, quit smoking       Depression Screen  03/22/2023    9:47 AM 03/20/2022    8:56 AM 03/20/2022    8:53 AM 02/22/2022    3:50 PM  PHQ 2/9 Scores  PHQ - 2 Score 0 0 0 0  PHQ- 9 Score 0   2    Fall Risk    03/22/2023    9:46 AM 03/20/2022    8:56 AM  Fall Risk   Falls in the past year? 0 0  Number falls in past yr: 0 0  Injury with Fall? 0 0  Risk for fall due to : Medication side effect   Follow up Falls prevention discussed;Falls evaluation completed Falls evaluation completed;Education provided    MEDICARE RISK AT HOME:  Medicare Risk at Home - 03/22/23 0946     Any stairs in or around the home? Yes    If so, are there any without handrails? No    Home free of loose throw rugs in walkways, pet beds, electrical cords, etc? Yes    Adequate lighting in your home to reduce risk of falls? Yes    Life alert? No    Use of a cane, walker or w/c? No    Grab bars in the bathroom? Yes    Shower chair or bench in shower? Yes    Elevated toilet seat or a handicapped toilet? No             TIMED UP AND GO:  Was the test performed?  No    Cognitive Function:        03/22/2023    9:48 AM 03/20/2022    8:59 AM  6CIT Screen  What Year? 0 points 0 points  What month? 0 points 0 points  What time? 0 points 0 points  Count back from 20 0 points 0 points  Months in reverse 2 points 0 points  Repeat phrase 2 points 0 points  Total Score 4 points 0 points    Immunizations Immunization History   Administered Date(s) Administered   DTaP 11/24/1957, 01/14/1958, 03/10/1958, 06/13/1959   Influenza Split 04/19/2014   Influenza,inj,Quad PF,6+ Mos 05/28/2016   OPV 02/27/1955, 03/13/1955, 02/28/1956, 03/10/1958   Pneumococcal Conjugate-13 09/03/2019   Tdap 04/19/2014    TDAP status: Up to date  Flu Vaccine status: Due, Education has been provided regarding the importance of this vaccine. Advised may receive this vaccine at local pharmacy or Health Dept. Aware to provide a copy of the vaccination record if obtained from local pharmacy or Health Dept. Verbalized acceptance and understanding.  Pneumococcal vaccine status: Due, Education has been provided regarding the importance of this vaccine. Advised may receive this vaccine at local pharmacy or Health Dept. Aware to provide a copy of the vaccination record if obtained from local pharmacy or Health Dept. Verbalized acceptance and understanding.  Covid-19 vaccine status: Declined, Education has been provided regarding the importance of this vaccine but patient still declined. Advised may receive this vaccine at local pharmacy or Health Dept.or vaccine clinic. Aware to provide a copy of the vaccination record if obtained from local pharmacy or Health Dept. Verbalized acceptance and understanding.  Qualifies for Shingles Vaccine? Yes   Zostavax completed No   Shingrix Completed?: No.    Education has been provided regarding the importance of this vaccine. Patient has been advised to call insurance company to determine out of pocket expense if they have not yet received this vaccine. Advised may also receive vaccine at local pharmacy or Health Dept. Verbalized acceptance and understanding.  Screening  Tests Health Maintenance  Topic Date Due   COVID-19 Vaccine (1) Never done   Zoster Vaccines- Shingrix (1 of 2) Never done   Pneumonia Vaccine 27+ Years old (2 of 2 - PPSV23 or PCV20) 10/29/2019   INFLUENZA VACCINE  03/07/2023   Lung Cancer  Screening  08/04/2023   Colonoscopy  03/18/2024   Medicare Annual Wellness (AWV)  03/21/2024   DTaP/Tdap/Td (6 - Td or Tdap) 04/19/2024   Hepatitis C Screening  Completed   HPV VACCINES  Aged Out    Health Maintenance  Health Maintenance Due  Topic Date Due   COVID-19 Vaccine (1) Never done   Zoster Vaccines- Shingrix (1 of 2) Never done   Pneumonia Vaccine 36+ Years old (2 of 2 - PPSV23 or PCV20) 10/29/2019   INFLUENZA VACCINE  03/07/2023    Colorectal cancer screening: Type of screening: Colonoscopy. Completed 03/19/2019. Repeat every 5 years  Lung Cancer Screening: (Low Dose CT Chest recommended if Age 7-80 years, 20 pack-year currently smoking OR have quit w/in 15years.) does qualify.   Lung Cancer Screening Referral: CT scan 08/03/2022  Additional Screening:  Hepatitis C Screening: does qualify; Completed 03/30/2021  Vision Screening: Recommended annual ophthalmology exams for early detection of glaucoma and other disorders of the eye. Is the patient up to date with their annual eye exam?  Yes  Who is the provider or what is the name of the office in which the patient attends annual eye exams? [facility] If pt is not established with a provider, would they like to be referred to a provider to establish care? No .   Dental Screening: Recommended annual dental exams for proper oral hygiene  Diabetic Foot Exam: n/a  Community Resource Referral / Chronic Care Management: CRR required this visit?  No   CCM required this visit?  No     Plan:     I have personally reviewed and noted the following in the patient's chart:   Medical and social history Use of alcohol, tobacco or illicit drugs  Current medications and supplements including opioid prescriptions. Patient is not currently taking opioid prescriptions. Functional ability and status Nutritional status Physical activity Advanced directives List of other physicians Hospitalizations, surgeries, and ER visits  in previous 12 months Vitals Screenings to include cognitive, depression, and falls Referrals and appointments  In addition, I have reviewed and discussed with patient certain preventive protocols, quality metrics, and best practice recommendations. A written personalized care plan for preventive services as well as general preventive health recommendations were provided to patient.     Barb Merino, LPN   10/27/4008   After Visit Summary: (MyChart) Due to this being a telephonic visit, the after visit summary with patients personalized plan was offered to patient via MyChart   Nurse Notes: none

## 2023-05-13 DIAGNOSIS — K7469 Other cirrhosis of liver: Secondary | ICD-10-CM | POA: Diagnosis not present

## 2023-05-13 DIAGNOSIS — K760 Fatty (change of) liver, not elsewhere classified: Secondary | ICD-10-CM | POA: Diagnosis not present

## 2023-08-01 ENCOUNTER — Other Ambulatory Visit: Payer: Self-pay | Admitting: Nurse Practitioner

## 2023-08-01 DIAGNOSIS — K7469 Other cirrhosis of liver: Secondary | ICD-10-CM

## 2023-08-16 ENCOUNTER — Other Ambulatory Visit: Payer: Self-pay | Admitting: Family Medicine

## 2023-09-05 ENCOUNTER — Ambulatory Visit
Admission: RE | Admit: 2023-09-05 | Discharge: 2023-09-05 | Disposition: A | Discharge status: Home / Self Care | Payer: Medicare Other | Source: Ambulatory Visit | Attending: Nurse Practitioner | Admitting: Nurse Practitioner

## 2023-09-05 ENCOUNTER — Encounter: Payer: Self-pay | Admitting: Urology

## 2023-09-05 ENCOUNTER — Ambulatory Visit: Payer: Medicare Other | Admitting: Urology

## 2023-09-05 VITALS — BP 126/82 | HR 60 | Ht 68.0 in | Wt 189.0 lb

## 2023-09-05 DIAGNOSIS — R3915 Urgency of urination: Secondary | ICD-10-CM

## 2023-09-05 DIAGNOSIS — N401 Enlarged prostate with lower urinary tract symptoms: Secondary | ICD-10-CM

## 2023-09-05 DIAGNOSIS — Z87898 Personal history of other specified conditions: Secondary | ICD-10-CM

## 2023-09-05 DIAGNOSIS — N138 Other obstructive and reflux uropathy: Secondary | ICD-10-CM

## 2023-09-05 DIAGNOSIS — R3129 Other microscopic hematuria: Secondary | ICD-10-CM | POA: Diagnosis not present

## 2023-09-05 DIAGNOSIS — R972 Elevated prostate specific antigen [PSA]: Secondary | ICD-10-CM | POA: Diagnosis not present

## 2023-09-05 DIAGNOSIS — K7460 Unspecified cirrhosis of liver: Secondary | ICD-10-CM | POA: Diagnosis not present

## 2023-09-05 DIAGNOSIS — K7469 Other cirrhosis of liver: Secondary | ICD-10-CM

## 2023-09-05 DIAGNOSIS — K8020 Calculus of gallbladder without cholecystitis without obstruction: Secondary | ICD-10-CM | POA: Diagnosis not present

## 2023-09-05 LAB — BLADDER SCAN AMB NON-IMAGING

## 2023-09-05 MED ORDER — SOLIFENACIN SUCCINATE 5 MG PO TABS: 5.0000 mg | ORAL_TABLET | Freq: Every day | ORAL | 11 refills | Status: DC

## 2023-09-05 NOTE — Progress Notes (Signed)
Assessment: 1. BPH with obstruction/lower urinary tract symptoms   2. Microscopic hematuria; negative evaluation 7/24   3. Elevated PSA   4. Urgency of urination     Plan: Free and total PSA today Continue tamsulosin Continue finasteride I again discussed options for management of his BPH with lower urinary tract symptoms.  I discussed continued management with combination therapy, minimally invasive procedures, and surgical management with TURP, HoLEP, or simple prostatectomy. Trial of solifenacin 5 mg daily.  Rx sent. Return to office in 6 weeks.   Chief Complaint:  Chief Complaint  Patient presents with   Benign Prostatic Hypertrophy    History of Present Illness:  Hector Matthews is a 73 y.o. male who is seen for continued evaluation of microscopic hematuria, lower urinary tract symptoms and a history of elevated PSA. At his visit in 12/23, he reported worsening of his lower urinary tract symptoms for several months.  He had urgency with occasional urge incontinence, nocturia x 1, intermittent stream, decreased force of stream, and sensation of incomplete emptying.  No dysuria or gross hematuria.  No history of UTIs.  He was started on tamsulosin in November 2023 and had not noted some improvement in his urinary symptoms. IPSS = 18. PVR = 74 ml.  PSA from 11/23: 5.10. No prior PSA results available for review.  He is not aware of a previously elevated PSA test.  No prior prostate biopsy.  No family history of prostate cancer.  He works as a [employer].  He has a history of tobacco use smoking 1 pack/day for approximately 15 years.  He previously had quit for about 20 years.  He was given a trial of Myrbetriq 25 mg daily at his visit in 12/23.  At his visit in 2/24, he continued on tamsulosin with improvement in his urinary symptoms with addition of Myrbetriq 25 mg daily.  He had decreased urgency, decreased urge incontinence, and decreased nocturia.  No side effects from  the medication.  No dysuria or gross hematuria. IPSS = 18.  PSA 2/24:  2.8 with 25.7% free  At his visit in 5/24, he continued on tamsulosin.  He ran out of Myrbetriq approximately 1 month prior and did not refill it due to the cost of the medication.  He noted a increase in his lower urinary tract symptoms with increased urgency and urge incontinence since stopping the Myrbetriq.  No dysuria or gross hematuria. IPSS = 18. He was restarted on Myrbetriq 25 mg daily.   His U/A showed 3-10 RBCs. Repeat U/A from 6/24 showed 3-10 RBCs. CT hematuria study from 02/20/23 showed a subcentimeter low continue lesion in the lower pole of the right kidney too small to characterize, no renal or ureteral calculi, no obvious filling defects or evidence of obstruction, enlarged prostate with indentation of the bladder. Prostate volume is approximately 83 mL based on the CT.  He was evaluated with cystoscopy in 7/24 which showed lateral lobe enlargement with small median lobe and intravesical component; no papillary lesions seen.  He continued with urgency, intermittent stream, weak stream, and occasional urge incontinence.  No dysuria or gross hematuria.  He continues on tamsulosin.  He was not taking Myrbetriq.   IPSS = 24 QOL = 5. He was started on finasteride in July 2024.  He returns today for follow-up.  He continues on tamsulosin and finasteride.  He continues with lower urinary tract symptoms including urgency, intermittent stream, and occasional urge incontinence.  No dysuria or gross hematuria.  IPSS = 19 QOL = 5/6 today.   Portions of the above documentation were copied from a prior visit for review purposes only.   Past Medical History:  Past Medical History:  Diagnosis Date   Allergy    Cirrhosis (HCC)    Hepatitis C    Hyperlipidemia    Hypertension    Nonsustained ventricular tachycardia (HCC)    Asymptomatic, noted on Holter monitor   Premature ventricular contractions     Past  Surgical History:  Past Surgical History:  Procedure Laterality Date   COLONOSCOPY     GANGLION CYST EXCISION Right    foot   POLYPECTOMY      Allergies:  Allergies  Allergen Reactions   Varenicline Other (See Comments)    Sedation  Sedation, "felt bad while taking it"    Family History:  Family History  Problem Relation Age of Onset   Pancreatic cancer Father    Cancer Father    Hypertension Brother        x 2   Colon cancer Neg Hx    Colon polyps Neg Hx    Esophageal cancer Neg Hx    Rectal cancer Neg Hx    Stomach cancer Neg Hx     Social History:  Social History   Tobacco Use   Smoking status: Every Day    Current packs/day: 1.00    Average packs/day: 1 pack/day for 22.0 years (22.0 ttl pk-yrs)    Types: Cigarettes   Smokeless tobacco: Never  Vaping Use   Vaping status: Never Used  Substance Use Topics   Alcohol use: No   Drug use: No    ROS: Constitutional:  Negative for fever, chills, weight loss CV: Negative for chest pain, previous MI, hypertension Respiratory:  Negative for shortness of breath, wheezing, sleep apnea, frequent cough GI:  Negative for nausea, vomiting, bloody stool, GERD  Physical exam: BP 126/82   Pulse 60   Ht 5\' 8"  (1.727 m)   Wt 189 lb (85.7 kg)   BMI 28.74 kg/m  GENERAL APPEARANCE:  Well appearing, well developed, well nourished, NAD HEENT:  Atraumatic, normocephalic, oropharynx clear NECK:  Supple without lymphadenopathy or thyromegaly ABDOMEN:  Soft, non-tender, no masses EXTREMITIES:  Moves all extremities well, without clubbing, cyanosis, or edema NEUROLOGIC:  Alert and oriented x 3, normal gait, CN II-XII grossly intact MENTAL STATUS:  appropriate BACK:  Non-tender to palpation, No CVAT SKIN:  Warm, dry, and intact   Results: U/A: 6-10 WBC, 3-10 RBC  PVR = 0 ml

## 2023-09-06 ENCOUNTER — Encounter: Payer: Self-pay | Admitting: Urology

## 2023-09-06 LAB — PSA, TOTAL AND FREE
PSA, Free Pct: 20.7 %
PSA, Free: 0.31 ng/mL
Prostate Specific Ag, Serum: 1.5 ng/mL (ref 0.0–4.0)

## 2023-09-13 LAB — MICROSCOPIC EXAMINATION
Crystal Type: NONE SEEN
Crystals: NONE SEEN
Trichomonas, UA: NONE SEEN
Yeast, UA: NONE SEEN

## 2023-09-13 LAB — URINALYSIS, ROUTINE W REFLEX MICROSCOPIC
Bilirubin, UA: NEGATIVE
Glucose, UA: NEGATIVE
Ketones, UA: NEGATIVE
Leukocytes,UA: NEGATIVE
Nitrite, UA: NEGATIVE
Specific Gravity, UA: >1.03 — ABNORMAL HIGH (ref 1.005–1.030)
Urobilinogen, Ur: 2 mg/dL — ABNORMAL HIGH (ref 0.2–1.0)
pH, UA: 6.5 (ref 5.0–7.5)

## 2023-09-19 ENCOUNTER — Other Ambulatory Visit: Payer: Self-pay | Admitting: Urology

## 2023-09-19 DIAGNOSIS — N401 Enlarged prostate with lower urinary tract symptoms: Secondary | ICD-10-CM

## 2023-09-19 MED ORDER — TAMSULOSIN HCL 0.4 MG PO CAPS
0.4000 mg | ORAL_CAPSULE | Freq: Every day | ORAL | 3 refills | Status: DC
Start: 2023-09-19 — End: 2024-02-13

## 2023-09-24 ENCOUNTER — Other Ambulatory Visit: Payer: Self-pay | Admitting: Urology

## 2023-09-28 ENCOUNTER — Other Ambulatory Visit: Payer: Self-pay | Admitting: Family Medicine

## 2023-10-17 ENCOUNTER — Ambulatory Visit: Payer: Medicare Other | Admitting: Urology

## 2023-11-14 ENCOUNTER — Ambulatory Visit (INDEPENDENT_AMBULATORY_CARE_PROVIDER_SITE_OTHER): Admitting: Urology

## 2023-11-14 ENCOUNTER — Other Ambulatory Visit: Payer: Self-pay | Admitting: Family Medicine

## 2023-11-14 ENCOUNTER — Encounter: Payer: Self-pay | Admitting: Urology

## 2023-11-14 VITALS — BP 136/85 | HR 60 | Ht 68.0 in | Wt 192.0 lb

## 2023-11-14 DIAGNOSIS — R972 Elevated prostate specific antigen [PSA]: Secondary | ICD-10-CM | POA: Diagnosis not present

## 2023-11-14 DIAGNOSIS — N401 Enlarged prostate with lower urinary tract symptoms: Secondary | ICD-10-CM

## 2023-11-14 DIAGNOSIS — N138 Other obstructive and reflux uropathy: Secondary | ICD-10-CM

## 2023-11-14 DIAGNOSIS — R3915 Urgency of urination: Secondary | ICD-10-CM | POA: Diagnosis not present

## 2023-11-14 DIAGNOSIS — R3129 Other microscopic hematuria: Secondary | ICD-10-CM

## 2023-11-14 LAB — BLADDER SCAN AMB NON-IMAGING

## 2023-11-14 LAB — URINALYSIS, ROUTINE W REFLEX MICROSCOPIC
Bilirubin, UA: NEGATIVE
Glucose, UA: NEGATIVE
Ketones, UA: NEGATIVE
Nitrite, UA: NEGATIVE
Protein,UA: NEGATIVE
Specific Gravity, UA: 1.015 (ref 1.005–1.030)
Urobilinogen, Ur: 0.2 mg/dL (ref 0.2–1.0)
pH, UA: 6 (ref 5.0–7.5)

## 2023-11-14 LAB — MICROSCOPIC EXAMINATION

## 2023-11-14 NOTE — Progress Notes (Signed)
 Assessment: 1. BPH with obstruction/lower urinary tract symptoms   2. Microscopic hematuria; negative evaluation 7/24   3. Elevated PSA   4. Urgency of urination     Plan: Continue tamsulosin Continue finasteride I again discussed options for management of his BPH with lower urinary tract symptoms.  I discussed continued management with combination therapy, minimally invasive procedures, and surgical management with TURP, HoLEP, or simple prostatectomy. He would like to continue medical therapy at this time. Continue solifenacin 5 mg daily.  Rx sent. Return to office in 3 months  Chief Complaint:  Chief Complaint  Patient presents with   Benign Prostatic Hypertrophy    History of Present Illness:  Hector Matthews is a 73 y.o. male who is seen for continued evaluation of microscopic hematuria, lower urinary tract symptoms and a history of elevated PSA. At his visit in 12/23, he reported worsening of his lower urinary tract symptoms for several months.  He had urgency with occasional urge incontinence, nocturia x 1, intermittent stream, decreased force of stream, and sensation of incomplete emptying.  No dysuria or gross hematuria.  No history of UTIs.  He was started on tamsulosin in November 2023 and had not noted some improvement in his urinary symptoms. IPSS = 18. PVR = 74 ml.  PSA from 11/23: 5.10. No prior PSA results available for review.  He is not aware of a previously elevated PSA test.  No prior prostate biopsy.  No family history of prostate cancer.  He works as a [employer].  He has a history of tobacco use smoking 1 pack/day for approximately 15 years.  He previously had quit for about 20 years.  He was given a trial of Myrbetriq 25 mg daily at his visit in 12/23.  At his visit in 2/24, he continued on tamsulosin with improvement in his urinary symptoms with addition of Myrbetriq 25 mg daily.  He had decreased urgency, decreased urge incontinence, and decreased  nocturia.  No side effects from the medication.  No dysuria or gross hematuria. IPSS = 18.  PSA 2/24:  2.8 with 25.7% free  At his visit in 5/24, he continued on tamsulosin.  He ran out of Myrbetriq approximately 1 month prior and did not refill it due to the cost of the medication.  He noted a increase in his lower urinary tract symptoms with increased urgency and urge incontinence since stopping the Myrbetriq.  No dysuria or gross hematuria. IPSS = 18. He was restarted on Myrbetriq 25 mg daily.   His U/A showed 3-10 RBCs. Repeat U/A from 6/24 showed 3-10 RBCs. CT hematuria study from 02/20/23 showed a subcentimeter low continue lesion in the lower pole of the right kidney too small to characterize, no renal or ureteral calculi, no obvious filling defects or evidence of obstruction, enlarged prostate with indentation of the bladder. Prostate volume is approximately 83 mL based on the CT.  He was evaluated with cystoscopy in 7/24 which showed lateral lobe enlargement with small median lobe and intravesical component; no papillary lesions seen.  He continued with urgency, intermittent stream, weak stream, and occasional urge incontinence.  No dysuria or gross hematuria.  He continues on tamsulosin.  He was not taking Myrbetriq.   IPSS = 24 QOL = 5. He was started on finasteride in July 2024.  At his visit in January 2025, he continued on tamsulosin and finasteride.  He continued to have lower urinary tract symptoms including urgency, intermittent stream, and occasional urge incontinence.  No  dysuria or gross hematuria. IPSS = 19 QOL = 5/6. PVR = 0 mL PSA 1/25: 1.5 with 20% free He was given a trial of solifenacin 5 mg daily.  He returns today for follow-up.  He feels like his symptoms are improved with the addition of solifenacin.  He has decreased frequency and urgency.  He has not had any episodes of incontinence since taking the medication.  He continues with decreased stream, and  intermittency.  No dysuria or gross hematuria. IPSS = 16/5.   Portions of the above documentation were copied from a prior visit for review purposes only.   Past Medical History:  Past Medical History:  Diagnosis Date   Allergy    Cirrhosis (HCC)    Hepatitis C    Hyperlipidemia    Hypertension    Nonsustained ventricular tachycardia (HCC)    Asymptomatic, noted on Holter monitor   Premature ventricular contractions     Past Surgical History:  Past Surgical History:  Procedure Laterality Date   COLONOSCOPY     GANGLION CYST EXCISION Right    foot   POLYPECTOMY      Allergies:  Allergies  Allergen Reactions   Varenicline Other (See Comments)    Sedation  Sedation, "felt bad while taking it"    Family History:  Family History  Problem Relation Age of Onset   Pancreatic cancer Father    Cancer Father    Hypertension Brother        x 2   Colon cancer Neg Hx    Colon polyps Neg Hx    Esophageal cancer Neg Hx    Rectal cancer Neg Hx    Stomach cancer Neg Hx     Social History:  Social History   Tobacco Use   Smoking status: Every Day    Current packs/day: 1.00    Average packs/day: 1 pack/day for 22.0 years (22.0 ttl pk-yrs)    Types: Cigarettes   Smokeless tobacco: Never  Vaping Use   Vaping status: Never Used  Substance Use Topics   Alcohol use: No   Drug use: No    ROS: Constitutional:  Negative for fever, chills, weight loss CV: Negative for chest pain, previous MI, hypertension Respiratory:  Negative for shortness of breath, wheezing, sleep apnea, frequent cough GI:  Negative for nausea, vomiting, bloody stool, GERD  Physical exam: BP 136/85   Pulse 60   Ht 5\' 8"  (1.727 m)   Wt 192 lb (87.1 kg)   BMI 29.19 kg/m  GENERAL APPEARANCE:  Well appearing, well developed, well nourished, NAD HEENT:  Atraumatic, normocephalic, oropharynx clear NECK:  Supple without lymphadenopathy or thyromegaly ABDOMEN:  Soft, non-tender, no  masses EXTREMITIES:  Moves all extremities well, without clubbing, cyanosis, or edema NEUROLOGIC:  Alert and oriented x 3, normal gait, CN II-XII grossly intact MENTAL STATUS:  appropriate BACK:  Non-tender to palpation, No CVAT SKIN:  Warm, dry, and intact   Results: U/A: 0-5 WBC, 0-2 RBC  PVR = 3 ml

## 2024-02-12 NOTE — Progress Notes (Unsigned)
 Assessment: 1. BPH with obstruction/lower urinary tract symptoms   2. Microscopic hematuria; negative evaluation 7/24   3. Elevated PSA   4. Urgency of urination     Plan: Continue tamsulosin  Continue finasteride  Continue solifenacin  5 mg daily.  Free and total PSA today Return to office in 6 months  Chief Complaint:  Chief Complaint  Patient presents with   Benign Prostatic Hypertrophy    History of Present Illness:  Hector Matthews is a 73 y.o. male who is seen for continued evaluation of microscopic hematuria, lower urinary tract symptoms and a history of elevated PSA. At his visit in 12/23, he reported worsening of his lower urinary tract symptoms for several months.  He had urgency with occasional urge incontinence, nocturia x 1, intermittent stream, decreased force of stream, and sensation of incomplete emptying.  No dysuria or gross hematuria.  No history of UTIs.  He was started on tamsulosin  in November 2023 and had not noted some improvement in his urinary symptoms. IPSS = 18. PVR = 74 ml.  PSA from 11/23: 5.10. No prior PSA results available for review.  He is not aware of a previously elevated PSA test.  No prior prostate biopsy.  No family history of prostate cancer.  He works as a [employer].  He has a history of tobacco use smoking 1 pack/day for approximately 15 years.  He previously had quit for about 20 years.  He was given a trial of Myrbetriq  25 mg daily at his visit in 12/23.  At his visit in 2/24, he continued on tamsulosin  with improvement in his urinary symptoms with addition of Myrbetriq  25 mg daily.  He had decreased urgency, decreased urge incontinence, and decreased nocturia.  No side effects from the medication.  No dysuria or gross hematuria. IPSS = 18.  PSA 2/24:  2.8 with 25.7% free  At his visit in 5/24, he continued on tamsulosin .  He ran out of Myrbetriq  approximately 1 month prior and did not refill it due to the cost of the medication.  He  noted a increase in his lower urinary tract symptoms with increased urgency and urge incontinence since stopping the Myrbetriq .  No dysuria or gross hematuria. IPSS = 18. He was restarted on Myrbetriq  25 mg daily.   His U/A showed 3-10 RBCs. Repeat U/A from 6/24 showed 3-10 RBCs. CT hematuria study from 02/20/23 showed a subcentimeter low continue lesion in the lower pole of the right kidney too small to characterize, no renal or ureteral calculi, no obvious filling defects or evidence of obstruction, enlarged prostate with indentation of the bladder. Prostate volume  approximately 83 mL based on the CT.  He was evaluated with cystoscopy in 7/24 which showed lateral lobe enlargement with small median lobe and intravesical component; no papillary lesions seen.  He continued with urgency, intermittent stream, weak stream, and occasional urge incontinence.  No dysuria or gross hematuria.  He continues on tamsulosin .  He was not taking Myrbetriq .   IPSS = 24 QOL = 5. He was started on finasteride  in July 2024.  At his visit in January 2025, he continued on tamsulosin  and finasteride .  He continued to have lower urinary tract symptoms including urgency, intermittent stream, and occasional urge incontinence.  No dysuria or gross hematuria. IPSS = 19/5 PVR = 0 mL PSA 1/25: 1.5 with 20% free He was given a trial of solifenacin  5 mg daily.  His visit in April 2025, he reported his symptoms were improved with the addition  of solifenacin .  He had decreased frequency and urgency.  He had not had any episodes of incontinence since taking the medication.  He continued with decreased stream, and intermittency.  No dysuria or gross hematuria. IPSS = 16/5. PVR = 3 mL.  He returns today for follow-up.  His wife died 2 months ago and he has been dealing with a number of issues since that time.  He reports that he has not been taking his BPH meds for at least a month as he was unable to get these filled while out  of town.  His lower urinary tract symptoms are stable.  He continues with a decreased stream, hesitancy, and sensation of incomplete emptying.  No dysuria or gross hematuria.  He reported that he was doing well with the combination of tamsulosin , finasteride , and solifenacin . IPSS = 10/3.  Portions of the above documentation were copied from a prior visit for review purposes only.   Past Medical History:  Past Medical History:  Diagnosis Date   Allergy    Cirrhosis (HCC)    Hepatitis C    Hyperlipidemia    Hypertension    Nonsustained ventricular tachycardia (HCC)    Asymptomatic, noted on Holter monitor   Premature ventricular contractions     Past Surgical History:  Past Surgical History:  Procedure Laterality Date   COLONOSCOPY     GANGLION CYST EXCISION Right    foot   POLYPECTOMY      Allergies:  Allergies  Allergen Reactions   Varenicline  Other (See Comments)    Sedation  Sedation, felt bad while taking it    Family History:  Family History  Problem Relation Age of Onset   Pancreatic cancer Father    Cancer Father    Hypertension Brother        x 2   Colon cancer Neg Hx    Colon polyps Neg Hx    Esophageal cancer Neg Hx    Rectal cancer Neg Hx    Stomach cancer Neg Hx     Social History:  Social History   Tobacco Use   Smoking status: Every Day    Current packs/day: 1.00    Average packs/day: 1 pack/day for 22.0 years (22.0 ttl pk-yrs)    Types: Cigarettes   Smokeless tobacco: Never  Vaping Use   Vaping status: Never Used  Substance Use Topics   Alcohol use: No   Drug use: No    ROS: Constitutional:  Negative for fever, chills, weight loss CV: Negative for chest pain, previous MI, hypertension Respiratory:  Negative for shortness of breath, wheezing, sleep apnea, frequent cough GI:  Negative for nausea, vomiting, bloody stool, GERD  Physical exam: BP (!) 144/88   Pulse 60   Ht 5' 8 (1.727 m)   Wt 189 lb (85.7 kg)   BMI 28.74 kg/m   GENERAL APPEARANCE:  Well appearing, well developed, well nourished, NAD HEENT:  Atraumatic, normocephalic, oropharynx clear NECK:  Supple without lymphadenopathy or thyromegaly ABDOMEN:  Soft, non-tender, no masses EXTREMITIES:  Moves all extremities well, without clubbing, cyanosis, or edema NEUROLOGIC:  Alert and oriented x 3, normal gait, CN II-XII grossly intact MENTAL STATUS:  appropriate BACK:  Non-tender to palpation, No CVAT SKIN:  Warm, dry, and intact GU: Prostate: 50 g, NT, no nodules Rectum: Normal tone,  no masses or tenderness   Results: U/A: 6-10 WBC, 3-10 RBC

## 2024-02-13 ENCOUNTER — Encounter: Payer: Self-pay | Admitting: Urology

## 2024-02-13 ENCOUNTER — Ambulatory Visit (INDEPENDENT_AMBULATORY_CARE_PROVIDER_SITE_OTHER): Admitting: Urology

## 2024-02-13 VITALS — BP 144/88 | HR 60 | Ht 68.0 in | Wt 189.0 lb

## 2024-02-13 DIAGNOSIS — N401 Enlarged prostate with lower urinary tract symptoms: Secondary | ICD-10-CM

## 2024-02-13 DIAGNOSIS — N138 Other obstructive and reflux uropathy: Secondary | ICD-10-CM

## 2024-02-13 DIAGNOSIS — R3915 Urgency of urination: Secondary | ICD-10-CM | POA: Diagnosis not present

## 2024-02-13 DIAGNOSIS — R3129 Other microscopic hematuria: Secondary | ICD-10-CM | POA: Diagnosis not present

## 2024-02-13 DIAGNOSIS — R972 Elevated prostate specific antigen [PSA]: Secondary | ICD-10-CM

## 2024-02-13 LAB — MICROSCOPIC EXAMINATION

## 2024-02-13 LAB — URINALYSIS, ROUTINE W REFLEX MICROSCOPIC
Bilirubin, UA: NEGATIVE
Glucose, UA: NEGATIVE
Nitrite, UA: NEGATIVE
Specific Gravity, UA: 1.02 (ref 1.005–1.030)
Urobilinogen, Ur: 0.2 mg/dL (ref 0.2–1.0)
pH, UA: 6 (ref 5.0–7.5)

## 2024-02-13 MED ORDER — SOLIFENACIN SUCCINATE 5 MG PO TABS: 5.0000 mg | ORAL_TABLET | Freq: Every day | ORAL | 3 refills | Status: AC

## 2024-02-13 MED ORDER — TAMSULOSIN HCL 0.4 MG PO CAPS: 0.4000 mg | ORAL_CAPSULE | Freq: Every day | ORAL | 3 refills | Status: AC

## 2024-02-13 MED ORDER — FINASTERIDE 5 MG PO TABS: 5.0000 mg | ORAL_TABLET | Freq: Every day | ORAL | 3 refills | Status: AC

## 2024-02-14 ENCOUNTER — Ambulatory Visit: Payer: Self-pay | Admitting: Urology

## 2024-02-14 LAB — PSA, TOTAL AND FREE
PSA, Free Pct: 29.3 %
PSA, Free: 0.82 ng/mL
Prostate Specific Ag, Serum: 2.8 ng/mL (ref 0.0–4.0)

## 2024-02-16 ENCOUNTER — Other Ambulatory Visit: Payer: Self-pay | Admitting: Family Medicine

## 2024-02-17 ENCOUNTER — Other Ambulatory Visit: Payer: Self-pay

## 2024-02-17 DIAGNOSIS — R001 Bradycardia, unspecified: Secondary | ICD-10-CM

## 2024-02-17 DIAGNOSIS — K760 Fatty (change of) liver, not elsewhere classified: Secondary | ICD-10-CM | POA: Diagnosis not present

## 2024-02-17 DIAGNOSIS — K7469 Other cirrhosis of liver: Secondary | ICD-10-CM | POA: Diagnosis not present

## 2024-02-17 DIAGNOSIS — I10 Essential (primary) hypertension: Secondary | ICD-10-CM

## 2024-02-17 DIAGNOSIS — I493 Ventricular premature depolarization: Secondary | ICD-10-CM

## 2024-02-18 ENCOUNTER — Other Ambulatory Visit: Payer: Self-pay | Admitting: Nurse Practitioner

## 2024-02-18 DIAGNOSIS — K7469 Other cirrhosis of liver: Secondary | ICD-10-CM | POA: Diagnosis not present

## 2024-02-18 DIAGNOSIS — K760 Fatty (change of) liver, not elsewhere classified: Secondary | ICD-10-CM | POA: Diagnosis not present

## 2024-03-03 ENCOUNTER — Other Ambulatory Visit: Payer: Medicare Other

## 2024-03-03 ENCOUNTER — Other Ambulatory Visit (HOSPITAL_COMMUNITY): Payer: Medicare Other

## 2024-03-09 ENCOUNTER — Ambulatory Visit: Payer: Self-pay | Admitting: Cardiology

## 2024-03-13 ENCOUNTER — Ambulatory Visit (HOSPITAL_COMMUNITY)
Admission: RE | Admit: 2024-03-13 | Discharge: 2024-03-13 | Disposition: A | Discharge status: Home / Self Care | Source: Ambulatory Visit | Attending: Cardiology | Admitting: Cardiology

## 2024-03-13 DIAGNOSIS — R001 Bradycardia, unspecified: Secondary | ICD-10-CM

## 2024-03-13 DIAGNOSIS — F17210 Nicotine dependence, cigarettes, uncomplicated: Secondary | ICD-10-CM | POA: Insufficient documentation

## 2024-03-13 DIAGNOSIS — I493 Ventricular premature depolarization: Secondary | ICD-10-CM

## 2024-03-13 DIAGNOSIS — I10 Essential (primary) hypertension: Secondary | ICD-10-CM | POA: Diagnosis not present

## 2024-03-13 DIAGNOSIS — E785 Hyperlipidemia, unspecified: Secondary | ICD-10-CM | POA: Diagnosis not present

## 2024-03-13 DIAGNOSIS — I340 Nonrheumatic mitral (valve) insufficiency: Secondary | ICD-10-CM | POA: Diagnosis not present

## 2024-03-13 DIAGNOSIS — J439 Emphysema, unspecified: Secondary | ICD-10-CM | POA: Insufficient documentation

## 2024-03-13 LAB — ECHOCARDIOGRAM COMPLETE
Area-P 1/2: 2.29 cm2
S' Lateral: 2.7 cm

## 2024-03-16 ENCOUNTER — Ambulatory Visit
Admission: RE | Admit: 2024-03-16 | Discharge: 2024-03-16 | Disposition: A | Discharge status: Home / Self Care | Source: Ambulatory Visit | Attending: Nurse Practitioner | Admitting: Nurse Practitioner

## 2024-03-16 DIAGNOSIS — K760 Fatty (change of) liver, not elsewhere classified: Secondary | ICD-10-CM

## 2024-03-16 DIAGNOSIS — K828 Other specified diseases of gallbladder: Secondary | ICD-10-CM | POA: Diagnosis not present

## 2024-03-16 DIAGNOSIS — K7460 Unspecified cirrhosis of liver: Secondary | ICD-10-CM | POA: Diagnosis not present

## 2024-03-16 DIAGNOSIS — K7469 Other cirrhosis of liver: Secondary | ICD-10-CM

## 2024-03-23 ENCOUNTER — Ambulatory Visit (INDEPENDENT_AMBULATORY_CARE_PROVIDER_SITE_OTHER): Payer: Medicare Other

## 2024-03-23 DIAGNOSIS — Z0000 Encounter for general adult medical examination without abnormal findings: Secondary | ICD-10-CM | POA: Diagnosis not present

## 2024-03-23 NOTE — Progress Notes (Signed)
 Subjective:   Hector Matthews is a 73 y.o. who presents for a Medicare Wellness preventive visit.  As a reminder, Annual Wellness Visits don't include a physical exam, and some assessments may be limited, especially if this visit is performed virtually. We may recommend an in-person follow-up visit with your provider if needed.  Visit Complete: Virtual I connected with  Hector Matthews on 03/23/24 by a audio enabled telemedicine application and verified that I am speaking with the correct person using two identifiers.  Patient Location: Home  Provider Location: Office/Clinic  I discussed the limitations of evaluation and management by telemedicine. The patient expressed understanding and agreed to proceed.  Vital Signs: Because this visit was a virtual/telehealth visit, some criteria may be missing or patient reported. Any vitals not documented were not able to be obtained and vitals that have been documented are patient reported.  VideoError- Librarian, academic were attempted between this provider and patient, however failed, due to patient having technical difficulties OR patient did not have access to video capability.  We continued and completed visit with audio only.   Persons Participating in Visit: Patient.  AWV Questionnaire: No: Patient Medicare AWV questionnaire was not completed prior to this visit.  Cardiac Risk Factors include: advanced age (>44men, >26 women);dyslipidemia;hypertension;male gender     Objective:    Today's Vitals   There is no height or weight on file to calculate BMI.     03/23/2024    9:32 AM 03/22/2023    9:45 AM 03/20/2022    8:55 AM  Advanced Directives  Does Patient Have a Medical Advance Directive? No No No;Yes  Type of Best boy of [place];Living will  Copy of Healthcare Power of Attorney in Chart?   No - copy requested    Current Medications (verified) Outpatient Encounter Medications  as of 03/23/2024  Medication Sig   atorvastatin  (LIPITOR) 10 MG tablet TAKE ONE TABLET BY MOUTH ONE TIME DAILY   finasteride  (PROSCAR ) 5 MG tablet Take 1 tablet (5 mg total) by mouth daily.   Iron , Ferrous Sulfate , 325 (65 Fe) MG TABS Take 325 mg by mouth daily.   lisinopril -hydrochlorothiazide  (ZESTORETIC ) 10-12.5 MG tablet TAKE ONE TABLET BY MOUTH ONE TIME DAILY   solifenacin  (VESICARE ) 5 MG tablet Take 1 tablet (5 mg total) by mouth daily.   tamsulosin  (FLOMAX ) 0.4 MG CAPS capsule Take 1 capsule (0.4 mg total) by mouth daily.   No facility-administered encounter medications on file as of 03/23/2024.    Allergies (verified) Varenicline    History: Past Medical History:  Diagnosis Date   Allergy    Cirrhosis (HCC)    Hepatitis C    Hyperlipidemia    Hypertension    Nonsustained ventricular tachycardia (HCC)    Asymptomatic, noted on Holter monitor   Premature ventricular contractions    Past Surgical History:  Procedure Laterality Date   COLONOSCOPY     GANGLION CYST EXCISION Right    foot   POLYPECTOMY     Family History  Problem Relation Age of Onset   Pancreatic cancer Father    Cancer Father    Hypertension Brother        x 2   Colon cancer Neg Hx    Colon polyps Neg Hx    Esophageal cancer Neg Hx    Rectal cancer Neg Hx    Stomach cancer Neg Hx    Social History   Socioeconomic History   Marital status: Widowed  Spouse name: Not on file   Number of children: 0   Years of education: Not on file   Highest education level: Not on file  Occupational History   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  Tobacco Use   Smoking status: Every Day    Current packs/day: 1.00    Average packs/day: 1 pack/day for 22.0 years (22.0 ttl pk-yrs)    Types: Cigarettes   Smokeless tobacco: Never  Vaping Use   Vaping status: Never Used  Substance and Sexual Activity   Alcohol use: No   Drug use: No   Sexual activity: Not on file  Other Topics Concern   Not on file   Social History Narrative   Not on file   Social Drivers of Health   Financial Resource Strain: Low Risk  (03/23/2024)   Overall Financial Resource Strain (CARDIA)    Difficulty of Paying Living Expenses: Not hard at all  Food Insecurity: No Food Insecurity (03/23/2024)   Hunger Vital Sign    Worried About Running Out of Food in the Last Year: Never true    Ran Out of Food in the Last Year: Never true  Transportation Needs: No Transportation Needs (03/23/2024)   PRAPARE - Administrator, Civil Service (Medical): No    Lack of Transportation (Non-Medical): No  Physical Activity: Inactive (03/23/2024)   Exercise Vital Sign    Days of Exercise per Week: 0 days    Minutes of Exercise per Session: 0 min  Stress: No Stress Concern Present (03/23/2024)   [employer] of Occupational Health - Occupational Stress Questionnaire    Feeling of Stress: Not at all  Social Connections: Socially Isolated (03/23/2024)   Social Connection and Isolation Panel    Frequency of Communication with Friends and Family: More than three times a week    Frequency of Social Gatherings with Friends and Family: Not on file    Attends Religious Services: Never    Active Member of Clubs or Organizations: No    Attends Banker Meetings: Never    Marital Status: Widowed    Tobacco Counseling Ready to quit: Not Answered Counseling given: Not Answered    Clinical Intake:  Pre-visit preparation completed: Yes  Pain : No/denies pain     Nutritional Risks: None Diabetes: No  No results found for: HGBA1C   How often do you need to have someone help you when you read instructions, pamphlets, or other written materials from your doctor or pharmacy?: 1 - Never  Interpreter Needed?: No  Information entered by :: NAllen LPN   Activities of Daily Living     03/23/2024    9:27 AM  In your present state of health, do you have any difficulty performing the following activities:   Hearing? 0  Vision? 0  Difficulty concentrating or making decisions? 0  Walking or climbing stairs? 0  Dressing or bathing? 0  Doing errands, shopping? 0  Preparing Food and eating ? N  Using the Toilet? N  In the past six months, have you accidently leaked urine? Y  Comment sees urology  Do you have problems with loss of bowel control? N  Managing your Medications? N  Managing your Finances? N  Housekeeping or managing your Housekeeping? N    Patient Care Team: Sebastian Beverley NOVAK, MD as PCP - General (Family Medicine)  I have updated your Care Teams any recent Medical Services you may have received from other providers in the past year.  Assessment:   This is a routine wellness examination for [place].  Hearing/Vision screen Hearing Screening - Comments:: Denies hearing issues Vision Screening - Comments:: Regular eye exams, [facility]   Goals Addressed             This Visit's Progress    Patient Stated       03/23/2024, denies goals       Depression Screen     03/23/2024    9:33 AM 03/22/2023    9:47 AM 03/20/2022    8:56 AM 03/20/2022    8:53 AM 02/22/2022    3:50 PM  PHQ 2/9 Scores  PHQ - 2 Score 0 0 0 0 0  PHQ- 9 Score  0   2    Fall Risk     03/23/2024    9:33 AM 03/22/2023    9:46 AM 03/20/2022    8:56 AM  Fall Risk   Falls in the past year? 0 0 0  Number falls in past yr: 0 0 0  Injury with Fall? 0 0 0  Risk for fall due to : Medication side effect Medication side effect   Follow up Falls evaluation completed;Falls prevention discussed Falls prevention discussed;Falls evaluation completed Falls evaluation completed;Education provided      Data saved with a previous flowsheet row definition    MEDICARE RISK AT HOME:  Medicare Risk at Home Any stairs in or around the home?: Yes If so, are there any without handrails?: No Home free of loose throw rugs in walkways, pet beds, electrical cords, etc?: Yes Adequate lighting in your home to  reduce risk of falls?: Yes Life alert?: No Use of a cane, walker or w/c?: No Grab bars in the bathroom?: Yes Shower chair or bench in shower?: Yes Elevated toilet seat or a handicapped toilet?: Yes  TIMED UP AND GO:  Was the test performed?  No  Cognitive Function: 6CIT completed        03/23/2024    9:33 AM 03/22/2023    9:48 AM 03/20/2022    8:59 AM  6CIT Screen  What Year? 0 points 0 points 0 points  What month? 0 points 0 points 0 points  What time? 0 points 0 points 0 points  Count back from 20 0 points 0 points 0 points  Months in reverse 2 points 2 points 0 points  Repeat phrase 0 points 2 points 0 points  Total Score 2 points 4 points 0 points    Immunizations Immunization History  Administered Date(s) Administered   DTaP 11/24/1957, 01/14/1958, 03/10/1958, 06/13/1959   Fluzone Influenza virus vaccine,trivalent (IIV3), split virus 04/19/2014   Influenza,inj,Quad PF,6+ Mos 05/28/2016   OPV 02/27/1955, 03/13/1955, 02/28/1956, 03/10/1958   Pneumococcal Conjugate-13 09/03/2019   Tdap 04/19/2014    Screening Tests Health Maintenance  Topic Date Due   Zoster Vaccines- Shingrix (1 of 2) Never done   Pneumococcal Vaccine: 50+ Years (2 of 2 - PPSV23, PCV20, or PCV21) 10/29/2019   COVID-19 Vaccine (1 - 2024-25 season) Never done   Lung Cancer Screening  08/04/2023   Colonoscopy  03/18/2024   INFLUENZA VACCINE  03/06/2024   DTaP/Tdap/Td (6 - Td or Tdap) 04/19/2024   Medicare Annual Wellness (AWV)  03/23/2025   Hepatitis C Screening  Completed   HPV VACCINES  Aged Out   Meningococcal B Vaccine  Aged Out    Health Maintenance  Health Maintenance Due  Topic Date Due   Zoster Vaccines- Shingrix (1 of 2) Never done  Pneumococcal Vaccine: 50+ Years (2 of 2 - PPSV23, PCV20, or PCV21) 10/29/2019   COVID-19 Vaccine (1 - 2024-25 season) Never done   Lung Cancer Screening  08/04/2023   Colonoscopy  03/18/2024   INFLUENZA VACCINE  03/06/2024   Health Maintenance  Items Addressed: Due for shingles, flu and pneumonia vaccine. Due for lung screening.  Additional Screening:  Vision Screening: Recommended annual ophthalmology exams for early detection of glaucoma and other disorders of the eye. Would you like a referral to an eye doctor? No    Dental Screening: Recommended annual dental exams for proper oral hygiene  Community Resource Referral / Chronic Care Management: CRR required this visit?  No   CCM required this visit?  No   Plan:    I have personally reviewed and noted the following in the patient's chart:   Medical and social history Use of alcohol, tobacco or illicit drugs  Current medications and supplements including opioid prescriptions. Patient is not currently taking opioid prescriptions. Functional ability and status Nutritional status Physical activity Advanced directives List of other physicians Hospitalizations, surgeries, and ER visits in previous 12 months Vitals Screenings to include cognitive, depression, and falls Referrals and appointments  In addition, I have reviewed and discussed with patient certain preventive protocols, quality metrics, and best practice recommendations. A written personalized care plan for preventive services as well as general preventive health recommendations were provided to patient.   Ardella FORBES Dawn, LPN   [id number]/81/7974   After Visit Summary: (MyChart) Due to this being a telephonic visit, the after visit summary with patients personalized plan was offered to patient via MyChart   Notes: Nothing significant to report at this time.

## 2024-03-23 NOTE — Patient Instructions (Signed)
 Mr. Mondry , Thank you for taking time out of your busy schedule to complete your Annual Wellness Visit with me. I enjoyed our conversation and look forward to speaking with you again next year. I, as well as your care team,  appreciate your ongoing commitment to your health goals. Please review the following plan we discussed and let me know if I can assist you in the future. Your Game plan/ To Do List    Referrals: If you haven't heard from the office you've been referred to, please reach out to them at the phone provided.   Follow up Visits: We will see or speak with you next year for your Next Medicare AWV with our clinical staff Have you seen your provider in the last 6 months (3 months if uncontrolled diabetes)? No  Clinician Recommendations:  Aim for 30 minutes of exercise or brisk walking, 6-8 glasses of water, and 5 servings of fruits and vegetables each day.       This is a list of the screenings recommended for you:  Health Maintenance  Topic Date Due   Zoster (Shingles) Vaccine (1 of 2) Never done   Pneumococcal Vaccine for age over 47 (2 of 2 - PPSV23, PCV20, or PCV21) 10/29/2019   COVID-19 Vaccine (1 - 2024-25 season) Never done   Screening for Lung Cancer  08/04/2023   Colon Cancer Screening  03/18/2024   Flu Shot  03/06/2024   DTaP/Tdap/Td vaccine (6 - Td or Tdap) 04/19/2024   Medicare Annual Wellness Visit  03/23/2025   Hepatitis C Screening  Completed   HPV Vaccine  Aged Out   Meningitis B Vaccine  Aged Out    Advanced directives: (ACP Link)Information on Advanced Care Planning can be found at [place]  Secretary of [facility] Advance Health Care Directives Advance Health Care Directives. [email or web address]  Advance Care Planning is important because it:  [x]  Makes sure you receive the medical care that is consistent with your values, goals, and preferences  [x]  It provides guidance to your family and loved ones and reduces their decisional burden about whether or not they  are making the right decisions based on your wishes.  Follow the link provided in your after visit summary or read over the paperwork we have mailed to you to help you started getting your Advance Directives in place. If you need assistance in completing these, please reach out to us  so that we can help you!  See attachments for Preventive Care and Fall Prevention Tips.

## 2024-03-24 ENCOUNTER — Ambulatory Visit: Payer: Self-pay | Admitting: Cardiology

## 2024-04-29 ENCOUNTER — Ambulatory Visit: Admitting: Family Medicine

## 2024-05-08 ENCOUNTER — Ambulatory Visit: Admitting: Family Medicine

## 2024-05-11 ENCOUNTER — Ambulatory Visit: Attending: Cardiology | Admitting: Cardiology

## 2024-05-11 ENCOUNTER — Ambulatory Visit

## 2024-05-11 ENCOUNTER — Encounter: Payer: Self-pay | Admitting: Cardiology

## 2024-05-11 VITALS — BP 120/72 | HR 65 | Ht 68.0 in | Wt 191.0 lb

## 2024-05-11 DIAGNOSIS — I493 Ventricular premature depolarization: Secondary | ICD-10-CM | POA: Insufficient documentation

## 2024-05-11 DIAGNOSIS — I10 Essential (primary) hypertension: Secondary | ICD-10-CM | POA: Diagnosis not present

## 2024-05-11 DIAGNOSIS — E782 Mixed hyperlipidemia: Secondary | ICD-10-CM | POA: Diagnosis not present

## 2024-05-11 DIAGNOSIS — R001 Bradycardia, unspecified: Secondary | ICD-10-CM

## 2024-05-11 DIAGNOSIS — F17210 Nicotine dependence, cigarettes, uncomplicated: Secondary | ICD-10-CM | POA: Insufficient documentation

## 2024-05-11 NOTE — Patient Instructions (Signed)
 Medication Instructions:  The current medical regimen is effective;  continue present plan and medications.  *If you need a refill on your cardiac medications before your next appointment, please call your pharmacy*  Please have Lipid Panel and your primary care doctor's office.  Testing/Procedures: Hector Matthews- Long Term Monitor Instructions  Your physician has requested you wear a ZIO patch monitor for 3 days.  This is a single patch monitor. [employer] supplies one patch monitor per enrollment. Additional stickers are not available. Please do not apply patch if you will be having a Nuclear Stress Test,  Echocardiogram, Cardiac CT, MRI, or Chest Xray during the period you would be wearing the  monitor. The patch cannot be worn during these tests. You cannot remove and re-apply the  ZIO XT patch monitor.  Your ZIO patch monitor will be mailed 3 day USPS to your address on file. It may take 3-5 days  to receive your monitor after you have been enrolled.  Once you have received your monitor, please review the enclosed instructions. Your monitor  has already been registered assigning a specific monitor serial # to you.  Billing and Patient Assistance Program Information  We have supplied [employer] with any of your insurance information on file for billing purposes. [employer] offers a sliding scale Patient Assistance Program for patients that do not have  insurance, or whose insurance does not completely cover the cost of the ZIO monitor.  You must apply for the Patient Assistance Program to qualify for this discounted rate.  To apply, please call [employer] at [phone], select option 4, select option 2, ask to apply for  Patient Assistance Program. Meredeth will ask your household income, and how many people  are in your household. They will quote your out-of-pocket cost based on that information.  [employer] will also be able to set up a 68-month, interest-free payment plan if needed.  Applying the  monitor   Shave hair from upper left chest.  Hold abrader disc by orange tab. Rub abrader in 40 strokes over the upper left chest as  indicated in your monitor instructions.  Clean area with 4 enclosed alcohol pads. Let dry.  Apply patch as indicated in monitor instructions. Patch will be placed under collarbone on left  side of chest with arrow pointing upward.  Rub patch adhesive wings for 2 minutes. Remove white label marked 1. Remove the white  label marked 2. Rub patch adhesive wings for 2 additional minutes.  While looking in a mirror, press and release button in center of patch. A small green light will  flash 3-4 times. This will be your only indicator that the monitor has been turned on.  Do not shower for the first 24 hours. You may shower after the first 24 hours.  Press the button if you feel a symptom. You will hear a small click. Record Date, Time and  Symptom in the Patient Logbook.  When you are ready to remove the patch, follow instructions on the last 2 pages of Patient  Logbook. Stick patch monitor onto the last page of Patient Logbook.  Place Patient Logbook in the blue and white box. Use locking tab on box and tape box closed  securely. The blue and white box has prepaid postage on it. Please place it in the mailbox as  soon as possible. Your physician should have your test results approximately 7 days after the  monitor has been mailed back to [facility].  Call [facility] Customer Care at  [phone] if you have questions regarding  your ZIO XT patch monitor. Call them immediately if you see an orange light blinking on your  monitor.  If your monitor falls off in less than 4 days, contact our Monitor department at [phone].  If your monitor becomes loose or falls off after 4 days call [employer] at [phone] for  suggestions on securing your monitor   Follow-Up: At [facility], you and your health needs are our priority.  As part of  our continuing mission to provide you with exceptional heart care, our providers are all part of one team.  This team includes your primary Cardiologist (physician) and Advanced Practice Providers or APPs (Physician Assistants and Nurse Practitioners) who all work together to provide you with the care you need, when you need it.  Your next appointment:   1 year(s)  Provider:   Dr Michele     We recommend signing up for the patient portal called MyChart.  Sign up information is provided on this After Visit Summary.  MyChart is used to connect with patients for Virtual Visits (Telemedicine).  Patients are able to view lab/test results, encounter notes, upcoming appointments, etc.  Non-urgent messages can be sent to your provider as well.   To learn more about what you can do with MyChart, go to [email or web address].

## 2024-05-11 NOTE — Progress Notes (Signed)
 Cardiology Office Note:  .   Date:  05/11/2024  ID:  Hector Matthews, DOB 04-03-1951, MRN [id number] PCP:  Hector Beverley NOVAK, MD  Former Cardiology Providers: N/A [facility] Providers Cardiologist:  Hector Large, DO , [facility] (established care 02/16/2020) Electrophysiologist:  None  Click to update primary MD,subspecialty MD or APP then REFRESH:1}    Chief Complaint  Patient presents with   Follow-up    1 year follow-up bradycardia/PVCs    History of Present Illness: .   Hector Matthews is a 73 y.o. Caucasian male whose past medical history and cardiovascular risk factors includes: Benign essential hypertension, tobacco use, hyperlipidemia, first-degree AV block, history of hepatitis C status posttreatment, cirrhosis, advanced age.   In the past patient was referred to the practice for evaluation of bradycardia at the request of his hepatologist.  He underwent appropriate testing as outlined below.  He was last seen in the office in July 2024 and now presents today for his annual follow-up.  Denies chest pain or heart failure symptoms No hospitalizations or urgent care visits for cardiovascular reasons since last office encounter. Unfortunately, patient lost his wife secondary cardiac arrest at the age of 43 this past May 2025, deepest condolences provided. Patient states that he still struggling on a day-to-day basis with life routines. Review of medication illustrates that he is no longer on metoprolol  which she was taking for PVCs in the past.  Patient does not recall when he stopped taking the medication or if there was a reason behind discontinuation. He plans to have a repeat lipids check in November 2025 with his annual well visit. Unfortunately, he continues to smoke 1 pack/day.  Review of Systems: .   Review of Systems  Cardiovascular:  Negative for chest pain, claudication, irregular heartbeat, leg swelling, near-syncope, orthopnea, palpitations, paroxysmal nocturnal  dyspnea and syncope.  Respiratory:  Negative for shortness of breath.   Hematologic/Lymphatic: Negative for bleeding problem.    Studies Reviewed:   EKG: EKG Interpretation Date/Time:  Monday May 11 2024 11:06:03 EDT Ventricular Rate:  66 PR Interval:  202 QRS Duration:  90 QT Interval:  420 QTC Calculation: 440 R Axis:   12  Text Interpretation: Normal sinus rhythm Low voltage QRS No previous ECGs available Confirmed by Matthews Hector [phone]) on 05/11/2024 11:10:28 AM  Echocardiogram: 02/16/2021: Left ventricle cavity is normal in size and wall thickness. Normal global wall motion. Normal LV systolic function with EF 63%. Doppler evidence of grade I (impaired) diastolic dysfunction, normal LAP. Aneurysmal interatrial septum without 2D or color Doppler evidence of shunting. Moderate (Grade II) mitral regurgitation. Normal right atrial pressure.    Stress Testing: Lexiscan  Tetrofosmin  stress test 03/06/2021: Low risk study.    14 day extended Holter monitor: Dominant rhythm normal sinus. Burden of sinus bradycardia approximately 5% Heart rate 41-214 bpm.  Avg HR 63 bpm. No atrial fibrillation, high grade AV block, pauses (3 seconds or longer). Total ventricular ectopic burden <1%. Isolated ventricular ectopic beats 1351 with a longest ventricular trigeminy episode approximately 25 seconds. 3 episodes of ventricular tachycardia: The fastest lasting 4 beats at a maximal rate of 214 bpm and the longest episode lasting 6 beats at an average of 136 bpm. Total supraventricular ectopic burden <1%.  The fastest episode of SVT was 5 beats in duration at 190 bpm. Patient triggered events: 0.  RADIOLOGY: NA  Risk Assessment/Calculations:   NA   Labs:        Lab Results  Component Value Date  CHOL 109 06/15/2022   HDL 33.40 (L) 06/15/2022   LDLCALC 57 06/15/2022   TRIG 92.0 06/15/2022   CHOLHDL 3 06/15/2022    Physical Exam:    Today's Vitals   05/11/24 1109  BP:  120/72  Pulse: 65  SpO2: 97%  Weight: 191 lb (86.6 kg)  Height: 5' 8 (1.727 m)   Body mass index is 29.04 kg/m. Wt Readings from Last 3 Encounters:  05/11/24 191 lb (86.6 kg)  02/13/24 189 lb (85.7 kg)  11/14/23 192 lb (87.1 kg)    Physical Exam  Constitutional: No distress.  hemodynamically stable  Neck: No JVD present.  Cardiovascular: Normal rate, regular rhythm, S1 normal and S2 normal. Exam reveals no gallop, no S3 and no S4.  No murmur heard. Pulmonary/Chest: Effort normal and breath sounds normal. No stridor. He has no wheezes. He has no rales.  Musculoskeletal:        General: No edema.     Cervical back: Neck supple.  Skin: Skin is warm.     Impression & Recommendation(s):  Impression:   ICD-10-CM   1. Bradycardia  R00.1 EKG 12-Lead    LONG TERM MONITOR (3-14 DAYS)    2. PVC's (premature ventricular contractions)  I49.3 LONG TERM MONITOR (3-14 DAYS)    3. Benign hypertension  I10     4. Mixed hyperlipidemia  E78.2     5. 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 Patient was on Toprol -XL 25 mg p.o. every morning Patient is unsure why and when metoprolol  was discontinued since last office visit. Will continue to hold pharmacological therapy as he remains asymptomatic. Hold with 3-day cardiac monitor to evaluate for overall PVC burden.  If he has a significant PVC burden we will reintroduce Toprol -XL 25 mg p.o. daily otherwise monitor clinically  Benign hypertension Office blood pressures are well-controlled. Continue lisinopril /hydrochlorothiazide  10/12.5 mg p.o. daily Reemphasized importance of low-salt diet.  Mixed hyperlipidemia Continue Lipitor 10 mg p.o. daily No recent labs available for review, will have it done in November 2025 as part of his yearly physical will send us  a copy for records  Continuous dependence on cigarette  smoking Tobacco cessation counseling: Currently smoking 1 packs/day   Patient denies claudication He is informed of the dangers of tobacco abuse including stroke, cancer, and MI, as well as benefits of tobacco cessation. He is not willing to quit at this time. 6 mins were spent counseling patient cessation techniques. We discussed various methods to help quit smoking, including deciding on a date to quit, joining a support group, pharmacological agents- nicotine gum/patch/lozenges.  See after visit summary, resources provided to him via QR code. I will reassess his progress at the next follow-up visit  Orders Placed:  Orders Placed This Encounter  Procedures   LONG TERM MONITOR (3-14 DAYS)    Standing Status:   Future    Number of Occurrences:   1    Expected Date:   05/11/2024    Expiration Date:   05/11/2025    Where should this test be performed?:   [facility]    Does the patient have an implanted cardiac device?:   No    Prescribed days of wear:   3    Type of enrollment:   Home Enrollment    Vendor::   [employer]    Reason for Exam:   PVC's I49.3, 147.10   EKG 12-Lead  Final Medication List:   No orders of the defined types were placed in this encounter.   There are no discontinued medications.   Current Outpatient Medications:    atorvastatin  (LIPITOR) 10 MG tablet, TAKE ONE TABLET BY MOUTH ONE TIME DAILY, Disp: 90 tablet, Rfl: 0   finasteride  (PROSCAR ) 5 MG tablet, Take 1 tablet (5 mg total) by mouth daily., Disp: 90 tablet, Rfl: 3   Iron , Ferrous Sulfate , 325 (65 Fe) MG TABS, Take 325 mg by mouth daily., Disp: 90 tablet, Rfl: 3   lisinopril -hydrochlorothiazide  (ZESTORETIC ) 10-12.5 MG tablet, TAKE ONE TABLET BY MOUTH ONE TIME DAILY, Disp: 90 tablet, Rfl: 0   solifenacin  (VESICARE ) 5 MG tablet, Take 1 tablet (5 mg total) by mouth daily., Disp: 90 tablet, Rfl: 3   tamsulosin  (FLOMAX ) 0.4 MG CAPS capsule, Take 1 capsule (0.4 mg total) by mouth daily., Disp: 90 capsule, Rfl:  3  Consent:   NA  Disposition:   1 year follow-up sooner if needed  His questions and concerns were addressed to his satisfaction. He voices understanding of the recommendations provided during this encounter.    Signed, Hector Michele HAS, [facility]  A Division of [facility] [address], [place], [place] [address]  05/11/2024 12:51 PM

## 2024-05-11 NOTE — Progress Notes (Unsigned)
 Enrolled for [employer] to mail a [employer] XT long term holter monitor to the patients address on file.

## 2024-05-31 DIAGNOSIS — R001 Bradycardia, unspecified: Secondary | ICD-10-CM | POA: Diagnosis not present

## 2024-05-31 DIAGNOSIS — I493 Ventricular premature depolarization: Secondary | ICD-10-CM | POA: Diagnosis not present

## 2024-06-02 ENCOUNTER — Other Ambulatory Visit: Payer: Self-pay | Admitting: Family Medicine

## 2024-06-02 NOTE — Telephone Encounter (Signed)
 Copied from CRM [id number]. Topic: Clinical - Medication Refill >> Jun 02, 2024  9:44 AM China J wrote: Medication: atorvastatin  (LIPITOR) 10 MG tablet  Has the patient contacted their pharmacy? Yes (Agent: If no, request that the patient contact the pharmacy for the refill. If patient does not wish to contact the pharmacy document the reason why and proceed with request.) (Agent: If yes, when and what did the pharmacy advise?)  This is the patient's preferred pharmacy:  [facility] [address] - [place], [place] - [address] [facility] [address] [place] [address] Phone: [phone] Fax: [phone]  Is this the correct pharmacy for this prescription? Yes If no, delete pharmacy and type the correct one.   Has the prescription been filled recently? No  Is the patient out of the medication? Yes  Has the patient been seen for an appointment in the last year OR does the patient have an upcoming appointment? Yes  Can we respond through MyChart? Yes  Agent: Please be advised that Rx refills may take up to 3 business days. We ask that you follow-up with your pharmacy.

## 2024-06-02 NOTE — Telephone Encounter (Signed)
 Per telephone encounter, The patient is going out of town Thursday morning and was wondering if the medication could be filled before then.

## 2024-06-28 ENCOUNTER — Ambulatory Visit: Payer: Self-pay | Admitting: Cardiology

## 2024-06-28 DIAGNOSIS — I493 Ventricular premature depolarization: Secondary | ICD-10-CM | POA: Diagnosis not present

## 2024-06-28 DIAGNOSIS — R001 Bradycardia, unspecified: Secondary | ICD-10-CM | POA: Diagnosis not present

## 2024-07-01 ENCOUNTER — Ambulatory Visit: Admitting: Family Medicine

## 2024-07-20 ENCOUNTER — Other Ambulatory Visit: Payer: Self-pay | Admitting: Nurse Practitioner

## 2024-07-20 DIAGNOSIS — K760 Fatty (change of) liver, not elsewhere classified: Secondary | ICD-10-CM

## 2024-07-20 DIAGNOSIS — K7469 Other cirrhosis of liver: Secondary | ICD-10-CM

## 2024-07-27 MED ORDER — METOPROLOL SUCCINATE ER 25 MG PO TB24: 25.0000 mg | ORAL_TABLET | Freq: Every day | ORAL | 3 refills | Status: DC

## 2024-07-27 NOTE — Telephone Encounter (Signed)
 Spoke with patient. Relayed Dr. Tyree note. All concerns addressed. Prescription was sent to [facility] at [facility].

## 2024-08-17 ENCOUNTER — Encounter: Payer: Self-pay | Admitting: Urology

## 2024-08-17 ENCOUNTER — Ambulatory Visit: Admitting: Urology

## 2024-08-17 VITALS — BP 129/77 | HR 48 | Ht 68.0 in | Wt 191.0 lb

## 2024-08-17 DIAGNOSIS — R3915 Urgency of urination: Secondary | ICD-10-CM

## 2024-08-17 DIAGNOSIS — N138 Other obstructive and reflux uropathy: Secondary | ICD-10-CM | POA: Diagnosis not present

## 2024-08-17 DIAGNOSIS — N401 Enlarged prostate with lower urinary tract symptoms: Secondary | ICD-10-CM

## 2024-08-17 DIAGNOSIS — R3129 Other microscopic hematuria: Secondary | ICD-10-CM | POA: Diagnosis not present

## 2024-08-17 DIAGNOSIS — R972 Elevated prostate specific antigen [PSA]: Secondary | ICD-10-CM

## 2024-08-17 LAB — URINALYSIS, ROUTINE W REFLEX MICROSCOPIC
Glucose, UA: NEGATIVE
Nitrite, UA: NEGATIVE
Specific Gravity, UA: 1.02 (ref 1.005–1.030)
Urobilinogen, Ur: 1 mg/dL (ref 0.2–1.0)
pH, UA: 5.5 (ref 5.0–7.5)

## 2024-08-17 LAB — MICROSCOPIC EXAMINATION

## 2024-08-17 NOTE — Progress Notes (Addendum)
 "  Assessment: 1. BPH with obstruction/lower urinary tract symptoms   2. Microscopic hematuria; negative evaluation 7/24   3. Elevated PSA   4. Urgency of urination     Plan: Continue tamsulosin  Continue finasteride  Continue solifenacin  5 mg daily.  Return to office in 6 months Free and total PSA next visit.  Chief Complaint:  Chief Complaint  Patient presents with   Follow-up    History of Present Illness:  Hector Matthews is a 74 y.o. male who is seen for continued evaluation of microscopic hematuria, lower urinary tract symptoms and a history of elevated PSA. At his visit in 12/23, he reported worsening of his lower urinary tract symptoms for several months.  He had urgency with occasional urge incontinence, nocturia x 1, intermittent stream, decreased force of stream, and sensation of incomplete emptying.  No dysuria or gross hematuria.  No history of UTIs.  He was started on tamsulosin  in November 2023 and had not noted some improvement in his urinary symptoms. IPSS = 18. PVR = 74 ml.  PSA from 11/23: 5.10. No prior PSA results available for review.  He is not aware of a previously elevated PSA test.  No prior prostate biopsy.  No family history of prostate cancer.  He works as a naval architect.  He has a history of tobacco use smoking 1 pack/day for approximately 15 years.  He previously had quit for about 20 years.  He was given a trial of Myrbetriq  25 mg daily at his visit in 12/23.  At his visit in 2/24, he continued on tamsulosin  with improvement in his urinary symptoms with addition of Myrbetriq  25 mg daily.  He had decreased urgency, decreased urge incontinence, and decreased nocturia.  No side effects from the medication.  No dysuria or gross hematuria. IPSS = 18.  PSA 2/24:  2.8 with 25.7% free  At his visit in 5/24, he continued on tamsulosin .  He ran out of Myrbetriq  approximately 1 month prior and did not refill it due to the cost of the medication.  He noted a  increase in his lower urinary tract symptoms with increased urgency and urge incontinence since stopping the Myrbetriq .  No dysuria or gross hematuria. IPSS = 18. He was restarted on Myrbetriq  25 mg daily.   His U/A showed 3-10 RBCs. Repeat U/A from 6/24 showed 3-10 RBCs. CT hematuria study from 02/20/23 showed a subcentimeter low continue lesion in the lower pole of the right kidney too small to characterize, no renal or ureteral calculi, no obvious filling defects or evidence of obstruction, enlarged prostate with indentation of the bladder. Prostate volume  approximately 83 mL based on the CT.  He was evaluated with cystoscopy in 7/24 which showed lateral lobe enlargement with small median lobe and intravesical component; no papillary lesions seen.  He continued with urgency, intermittent stream, weak stream, and occasional urge incontinence.  No dysuria or gross hematuria.  He continues on tamsulosin .  He was not taking Myrbetriq .   IPSS = 24 QOL = 5. He was started on finasteride  in July 2024.  At his visit in January 2025, he continued on tamsulosin  and finasteride .  He continued to have lower urinary tract symptoms including urgency, intermittent stream, and occasional urge incontinence.  No dysuria or gross hematuria. IPSS = 19/5 PVR = 0 mL PSA 1/25: 1.5 with 20% free He was given a trial of solifenacin  5 mg daily.  His visit in April 2025, he reported his symptoms were improved with the  addition of solifenacin .  He had decreased frequency and urgency.  He had not had any episodes of incontinence since taking the medication.  He continued with decreased stream, and intermittency.  No dysuria or gross hematuria. IPSS = 16/5. PVR = 3 mL.  He was seen in Mar 12, 2024.  His wife died 2 months prior to his visit and he had been dealing with a number of issues since that time.  He had not been taking his BPH meds for at least a month as he was unable to get these filled while out of town.  His  lower urinary tract symptoms remained stable.  He continued with a decreased stream, hesitancy, and sensation of incomplete emptying.  No dysuria or gross hematuria.  He reported that he was doing well with the combination of tamsulosin , finasteride , and solifenacin . IPSS = 10/3. PSA 7/25: 2.8 with 29.3% free  He returns today for follow-up.  He continues on tamsulosin , finasteride , and solifenacin  for his lower urinary tract symptoms.  He reports that his symptoms are fairly stable.  He continues to have some urgency, decreased stream, frequency, and intermittent stream.  No dysuria or gross hematuria. IPSS = 20/3.   Portions of the above documentation were copied from a prior visit for review purposes only.   Past Medical History:  Past Medical History:  Diagnosis Date   Allergy    Cirrhosis (HCC)    Hepatitis C    Hyperlipidemia    Hypertension    Nonsustained ventricular tachycardia (HCC)    Asymptomatic, noted on Holter monitor   Premature ventricular contractions     Past Surgical History:  Past Surgical History:  Procedure Laterality Date   COLONOSCOPY     GANGLION CYST EXCISION Right    foot   POLYPECTOMY      Allergies:  Allergies  Allergen Reactions   Varenicline  Other (See Comments)    Sedation  Sedation, felt bad while taking it    Family History:  Family History  Problem Relation Age of Onset   Pancreatic cancer Father    Cancer Father    Hypertension Brother        x 2   Colon cancer Neg Hx    Colon polyps Neg Hx    Esophageal cancer Neg Hx    Rectal cancer Neg Hx    Stomach cancer Neg Hx     Social History:  Social History   Tobacco Use   Smoking status: Every Day    Current packs/day: 1.00    Average packs/day: 1 pack/day for 22.0 years (22.0 ttl pk-yrs)    Types: Cigarettes   Smokeless tobacco: Never  Vaping Use   Vaping status: Never Used  Substance Use Topics   Alcohol use: No   Drug use: No    ROS: Constitutional:   Negative for fever, chills, weight loss CV: Negative for chest pain, previous MI, hypertension Respiratory:  Negative for shortness of breath, wheezing, sleep apnea, frequent cough GI:  Negative for nausea, vomiting, bloody stool, GERD  Physical exam: BP 129/77   Pulse (!) 48   Ht 5' 8 (1.727 m)   Wt 191 lb (86.6 kg)   BMI 29.04 kg/m  GENERAL APPEARANCE:  Well appearing, well developed, well nourished, NAD HEENT:  Atraumatic, normocephalic, oropharynx clear NECK:  Supple without lymphadenopathy or thyromegaly ABDOMEN:  Soft, non-tender, no masses EXTREMITIES:  Moves all extremities well, without clubbing, cyanosis, or edema NEUROLOGIC:  Alert and oriented x 3, normal gait, CN  II-XII grossly intact MENTAL STATUS:  appropriate BACK:  Non-tender to palpation, No CVAT SKIN:  Warm, dry, and intact  Results: U/A: 6-10 WBCs, 3-10 RBCs, moderate bacteria, nitrite negative "

## 2024-08-17 NOTE — Addendum Note (Signed)
 Addended by: OBADIAH ROSELEE RAMAN on: 08/17/2024 01:01 PM   Modules accepted: Orders

## 2024-08-18 ENCOUNTER — Encounter: Payer: Self-pay | Admitting: Family Medicine

## 2024-08-18 ENCOUNTER — Ambulatory Visit: Admitting: Family Medicine

## 2024-08-18 VITALS — BP 118/74 | HR 52 | Temp 96.8°F | Resp 16 | Ht 68.0 in | Wt 188.3 lb

## 2024-08-18 DIAGNOSIS — I440 Atrioventricular block, first degree: Secondary | ICD-10-CM

## 2024-08-18 DIAGNOSIS — D508 Other iron deficiency anemias: Secondary | ICD-10-CM | POA: Insufficient documentation

## 2024-08-18 DIAGNOSIS — Z131 Encounter for screening for diabetes mellitus: Secondary | ICD-10-CM | POA: Diagnosis not present

## 2024-08-18 DIAGNOSIS — C220 Liver cell carcinoma: Secondary | ICD-10-CM | POA: Diagnosis not present

## 2024-08-18 DIAGNOSIS — J439 Emphysema, unspecified: Secondary | ICD-10-CM | POA: Diagnosis not present

## 2024-08-18 DIAGNOSIS — I10 Essential (primary) hypertension: Secondary | ICD-10-CM

## 2024-08-18 DIAGNOSIS — Z1211 Encounter for screening for malignant neoplasm of colon: Secondary | ICD-10-CM

## 2024-08-18 DIAGNOSIS — E782 Mixed hyperlipidemia: Secondary | ICD-10-CM | POA: Diagnosis not present

## 2024-08-18 DIAGNOSIS — R001 Bradycardia, unspecified: Secondary | ICD-10-CM | POA: Diagnosis not present

## 2024-08-18 DIAGNOSIS — F17210 Nicotine dependence, cigarettes, uncomplicated: Secondary | ICD-10-CM

## 2024-08-18 DIAGNOSIS — K7469 Other cirrhosis of liver: Secondary | ICD-10-CM | POA: Diagnosis not present

## 2024-08-18 DIAGNOSIS — I4729 Other ventricular tachycardia: Secondary | ICD-10-CM | POA: Diagnosis not present

## 2024-08-18 LAB — COMPREHENSIVE METABOLIC PANEL WITH GFR
ALT: 13 U/L (ref 3–53)
AST: 18 U/L (ref 5–37)
Albumin: 3.6 g/dL (ref 3.5–5.2)
Alkaline Phosphatase: 47 U/L (ref 39–117)
BUN: 15 mg/dL (ref 6–23)
CO2: 26 meq/L (ref 19–32)
Calcium: 8.8 mg/dL (ref 8.4–10.5)
Chloride: 105 meq/L (ref 96–112)
Creatinine, Ser: 1.23 mg/dL (ref 0.40–1.50)
GFR: 59.04 mL/min — ABNORMAL LOW
Glucose, Bld: 89 mg/dL (ref 70–99)
Potassium: 4.4 meq/L (ref 3.5–5.1)
Sodium: 137 meq/L (ref 135–145)
Total Bilirubin: 0.5 mg/dL (ref 0.2–1.2)
Total Protein: 6.6 g/dL (ref 6.0–8.3)

## 2024-08-18 LAB — CBC WITH DIFFERENTIAL/PLATELET
Basophils Absolute: 0.1 K/uL (ref 0.0–0.1)
Basophils Relative: 0.7 % (ref 0.0–3.0)
Eosinophils Absolute: 0.2 K/uL (ref 0.0–0.7)
Eosinophils Relative: 2.6 % (ref 0.0–5.0)
HCT: 42.2 % (ref 39.0–52.0)
Hemoglobin: 14.7 g/dL (ref 13.0–17.0)
Lymphocytes Relative: 28 % (ref 12.0–46.0)
Lymphs Abs: 2.1 K/uL (ref 0.7–4.0)
MCHC: 34.9 g/dL (ref 30.0–36.0)
MCV: 98.9 fl (ref 78.0–100.0)
Monocytes Absolute: 0.7 K/uL (ref 0.1–1.0)
Monocytes Relative: 9.4 % (ref 3.0–12.0)
Neutro Abs: 4.5 K/uL (ref 1.4–7.7)
Neutrophils Relative %: 59.3 % (ref 43.0–77.0)
Platelets: 159 K/uL (ref 150.0–400.0)
RBC: 4.27 Mil/uL (ref 4.22–5.81)
RDW: 13 % (ref 11.5–15.5)
WBC: 7.6 K/uL (ref 4.0–10.5)

## 2024-08-18 LAB — LIPID PANEL
Cholesterol: 100 mg/dL (ref 28–200)
HDL: 33.7 mg/dL — ABNORMAL LOW
LDL Cholesterol: 54 mg/dL (ref 10–99)
NonHDL: 66.07
Total CHOL/HDL Ratio: 3
Triglycerides: 62 mg/dL (ref 10.0–149.0)
VLDL: 12.4 mg/dL (ref 0.0–40.0)

## 2024-08-18 LAB — TSH: TSH: 1.85 u[IU]/mL (ref 0.35–5.50)

## 2024-08-18 LAB — B12 AND FOLATE PANEL
Folate: 23.3 ng/mL
Vitamin B-12: 1301 pg/mL — ABNORMAL HIGH (ref 211–911)

## 2024-08-18 LAB — HEMOGLOBIN A1C: Hgb A1c MFr Bld: 5.3 % (ref 4.6–6.5)

## 2024-08-18 MED ORDER — ATORVASTATIN CALCIUM 10 MG PO TABS: 10.0000 mg | ORAL_TABLET | Freq: Every day | ORAL | 0 refills | Status: AC

## 2024-08-18 MED ORDER — ALBUTEROL SULFATE HFA 108 (90 BASE) MCG/ACT IN AERS: 2.0000 | INHALATION_SPRAY | Freq: Four times a day (QID) | RESPIRATORY_TRACT | 0 refills | Status: AC | PRN

## 2024-08-18 MED ORDER — LISINOPRIL-HYDROCHLOROTHIAZIDE 10-12.5 MG PO TABS: 1.0000 | ORAL_TABLET | Freq: Every day | ORAL | 0 refills | Status: AC

## 2024-08-18 MED ORDER — METOPROLOL SUCCINATE ER 25 MG PO TB24: 12.5000 mg | ORAL_TABLET | Freq: Every day | ORAL | 3 refills | Status: AC

## 2024-08-18 NOTE — Patient Instructions (Addendum)
 It was very nice to see you today!   VISIT SUMMARY: Today, we reviewed your chronic conditions and made some adjustments to your medications. Your blood pressure is well controlled, and we discussed your heart rate, lung health, and other ongoing treatments.  YOUR PLAN: PRIMARY HYPERTENSION: Your blood pressure is well controlled with your current medications. -Continue taking lisinopril -hydrochlorothiazide  10-12.5 mg daily and metoprolol  succinate 12.5 mg daily. -Check your blood pressure at home regularly. -Follow up in 3-6 months to recheck your blood pressure.  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 -Reduce metoprolol  succinate to 12.5 mg daily. -We checked your thyroid  function with a TSH test. -Continue follow-up with cardiology.  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 -Referred to pulmonology for a low-dose CT chest scan for lung cancer screening. -Counseled on smoking cessation to reduce risk of lung cancer and heart disease. -Prescribed albuterol  inhaler for rescue use.  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 -Continue follow-up with hepatology. -We checked your liver function tests (LFTs).  MIXED HYPERLIPIDEMIA: Your cholesterol levels are managed with medication. -Continue taking atorvastatin  10 mg daily.  IRON  DEFICIENCY ANEMIA: You have low iron  levels managed with supplements. -Continue taking ferrous sulfate  325 mg daily. -We checked your CBC, iron  studies, and folate levels.  BENIGN PROSTATIC HYPERPLASIA: You have an enlarged prostate managed with medications. -Continue taking tamsulosin , solifenacin , and finasteride .  GENERAL HEALTH MAINTENANCE:  You are due for cancer screenings and should maintain a healthy lifestyle. -Referred to gastroenterology for a colonoscopy. -Referred to pulmonology for a low-dose CT chest scan for lung cancer screening. -Encouraged smoking cessation. -Encouraged a diet rich in fruits and vegetables and regular exercise.                      Contains text generated by [employer].                                 Contains text generated by [employer].    Return in about 6 months (around 02/15/2025) for BP.   Take care, Arvella Hummer, MD, MS   PLEASE NOTE:  If you had any lab tests, please let us  know if you have not heard back within a few days. You may see your results on mychart before we have a chance to review them but we will give you a call once they are reviewed by us .   If we ordered any referrals today, please let us  know if you have not heard from their office within the next week.   If you had any urgent prescriptions sent in today, please check with the pharmacy within an hour of our visit to make sure the prescription was transmitted appropriately.   Please try these tips to maintain a healthy lifestyle:  Eat at least 3 REAL meals and 1-2 snacks per day.  Aim for no more than 5 hours between eating.  If you eat breakfast, please do so within one hour of getting up.   Each meal should contain half fruits/vegetables, one quarter protein, and one quarter carbs (no bigger than a computer mouse)  Cut down on sweet beverages. This includes juice, soda, and  sweet tea.   Drink at least 1 glass of water with each meal and aim for at least 8 glasses per day  Exercise at least 150 minutes every week.

## 2024-08-18 NOTE — Progress Notes (Signed)
 " Assessment & Plan   Assessment/Plan:     Assessment & Plan Primary hypertension Hypertension is well controlled with current medication regimen. Blood pressure today is 129/77 mmHg. - Continue lisinopril  and hydrochlorothiazide  for hypertension management. - Check blood pressure at home regularly. - Follow up in 3-6 months to recheck blood pressure.  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 Metoprolol  succinate is contributing to bradycardia. - Reduced metoprolol  succinate from 25 mg to 12.5 mg daily. - Checked TSH to evaluate thyroid  function. - Continue follow-up with cardiology.  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 Reports occasional wheezing at night and shortness of breath with exertion. - Referred to pulmonology for low-dose CT chest for lung cancer screening. - Counseled on smoking cessation to reduce risk of lung cancer and heart disease. - Prescribed albuterol  inhaler for rescue use.  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 Continues follow-up with hepatology. - Continue follow-up with hepatology. - Checked liver function tests (LFTs).  Mixed hyperlipidemia Managed with atorvastatin . - Continue atorvastatin  10 mg daily.  Iron  deficiency anemia Managed with ferrous sulfate . - Continue ferrous sulfate  325 mg daily. - Checked CBC, iron  studies, and folate levels.  Benign prostatic hyperplasia Managed with tamsulosin , solifenacin , and finasteride . - Continue tamsulosin , solifenacin , and finasteride .  General health maintenance Due for colon cancer screening and lung cancer screening. Colonoscopy recommended every 5 years. - Referred to gastroenterology for colonoscopy. - Referred to pulmonology for low-dose CT chest for lung  cancer screening. - Encouraged smoking cessation. - Encouraged diet rich in fruits and vegetables and regular exercise.        Medications Discontinued During This Encounter  Medication Reason   atorvastatin  (LIPITOR) 10 MG tablet Reorder   lisinopril -hydrochlorothiazide  (ZESTORETIC ) 10-12.5 MG tablet Reorder   metoprolol  succinate (TOPROL  XL) 25 MG 24 hr tablet     Return in about 6 months (around 02/15/2025) for BP.        Subjective:   Encounter date: 08/18/2024  Hector Matthews is a 74 y.o. male who has Primary hypertension; Bradycardia; First degree heart block; Hepatic cirrhosis (HCC); Hyperlipidemia, unspecified; Smoking greater than 40 pack years; Nocturia; Elevated PSA; Hepatoma [facility]); NSVT (nonsustained ventricular tachycardia) (HCC); Pulmonary emphysema (HCC); BPH with obstruction/lower urinary tract symptoms; Microscopic hematuria; negative evaluation 7/24; and Iron  deficiency anemia secondary to inadequate dietary iron  intake on their problem list..   He  has a past medical history of Allergy, Cirrhosis (HCC), Hepatitis C, Hyperlipidemia, Hypertension, Nonsustained ventricular tachycardia (HCC), and Premature ventricular contractions.SABRA   He presents with chief complaint of Medical Management of Chronic Issues (Patient presents today for a follow-up, his last visit was on 10-02-2022. He reports wife passed on 01-04-2024. He is currently still working as a naval architect. He denies any concerns today.) .   Discussed the use of AI scribe software for clinical note transcription with the patient, who gave verbal consent to proceed.  History of Present Illness Hector Matthews is a 74 year old male who presents for chronic disease management.  Hypertension - Blood pressure currently well controlled with lisinopril -hydrochlorothiazide  10-12.5 mg daily and metoprolol  succinate 25 mg daily - Not consistently adherent to antihypertensive medications - Uses apple cider vinegar gummies  and beetroot as part of routine  Cardiac arrhythmias and bradycardia - History of non-sustained ventricular tachycardia and first-degree AV block - Current  bradycardia at 52 bpm - Metoprolol  used for rate control - No chest pain, shortness of breath, or palpitations  Hyperlipidemia - Managed with atorvastatin  10 mg daily  Lower urinary tract symptoms - Benign prostatic hyperplasia managed with tamsulosin  0.4 mg daily, solifenacin  5 mg daily, and finasteride  5 mg daily - Recent evaluation by urologist  Chronic obstructive pulmonary disease and tobacco use - Pulmonary emphysema with greater than 50 pack-year smoking history - Occasional nocturnal wheezing - No regular shortness of breath - Does not regularly use albuterol  inhaler  Chronic liver disease - History of hepatoma and cirrhosis secondary to treated hepatitis C - Follows with hepatology - No recent symptoms related to liver disease  Iron  deficiency anemia - Managed with ferrous sulfate  325 mg daily  Cancer screening - Due for colon cancer screening - Last colonoscopy five years ago  Occupational and psychosocial factors - 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 90 tablet 3   Iron , Ferrous Sulfate , 325 (65 Fe) MG TABS Take 325 mg by mouth daily. 90 tablet 3   solifenacin  (VESICARE ) 5 MG tablet Take 1 tablet (5 mg total) by mouth daily. 90 tablet 3   tamsulosin  (FLOMAX ) 0.4 MG CAPS capsule Take 1 capsule (0.4 mg total) by mouth daily. 90 capsule 3   atorvastatin  (LIPITOR) 10 MG tablet TAKE ONE TABLET BY MOUTH ONE  TIME DAILY 90 tablet 0   lisinopril -hydrochlorothiazide  (ZESTORETIC ) 10-12.5 MG tablet TAKE ONE TABLET BY MOUTH ONE TIME DAILY 90 tablet 0   metoprolol  succinate (TOPROL  XL) 25 MG 24 hr tablet Take 1 tablet (25 mg total) by mouth daily. HOLD if Systolic Blood Pressure (top number) is less than 100 or Heart Rate is less than 55. 90 tablet 3   No facility-administered medications prior to visit.    Family History  Problem Relation Age of Onset   Pancreatic cancer Father    Cancer Father    Hypertension Brother        x 2   Colon cancer Neg Hx    Colon polyps Neg Hx    Esophageal cancer Neg Hx    Rectal cancer Neg Hx    Stomach cancer Neg Hx     Social History   Socioeconomic History   Marital status: Widowed    Spouse name: Not on file   Number of children: 0   Years of education: Not on file   Highest education level: Not on file  Occupational History   Occupation: truck civil service fast streamer  Tobacco Use   Smoking status: Every Day    Current packs/day: 1.00    Average packs/day: 1 pack/day for 22.0 years (22.0 ttl pk-yrs)    Types: Cigarettes   Smokeless tobacco: Never  Vaping Use   Vaping status: Never Used  Substance and Sexual Activity   Alcohol use: No   Drug use: No   Sexual activity: Not on file  Other Topics Concern   Not on file  Social History Narrative   Not on file   Social Drivers of  Health   Tobacco Use: High Risk (08/18/2024)   Patient History    Smoking Tobacco Use: Every Day    Smokeless Tobacco Use: Never    Passive Exposure: Not on file  Financial Resource Strain: Low Risk (03/23/2024)   Overall Financial Resource Strain (CARDIA)    Difficulty of Paying Living Expenses: Not hard at all  Food Insecurity: No Food Insecurity (03/23/2024)   Epic    Worried About Programme Researcher, Broadcasting/film/video in the Last Year: Never true    Ran Out of Food in the Last Year: Never true  Transportation Needs: No Transportation Needs (03/23/2024)   Epic    Lack of  Transportation (Medical): No    Lack of Transportation (Non-Medical): No  Physical Activity: Inactive (03/23/2024)   Exercise Vital Sign    Days of Exercise per Week: 0 days    Minutes of Exercise per Session: 0 min  Stress: No Stress Concern Present (03/23/2024)   [employer] of Occupational Health - Occupational Stress Questionnaire    Feeling of Stress: Not at all  Social Connections: Socially Isolated (03/23/2024)   Social Connection and Isolation Panel    Frequency of Communication with Friends and Family: More than three times a week    Frequency of Social Gatherings with Friends and Family: Not on file    Attends Religious Services: Never    Active Member of Clubs or Organizations: No    Attends Banker Meetings: Never    Marital Status: Widowed  Intimate Partner Violence: Not At Risk (03/23/2024)   Epic    Fear of Current or Ex-Partner: No    Emotionally Abused: No    Physically Abused: No    Sexually Abused: No  Depression (PHQ2-9): Low Risk (08/18/2024)   Depression (PHQ2-9)    PHQ-2 Score: 0  Alcohol Screen: Low Risk (03/23/2024)   Alcohol Screen    Last Alcohol Screening Score (AUDIT): 0  Housing: Unknown (03/23/2024)   Epic    Unable to Pay for Housing in the Last Year: No    Number of Times Moved in the Last Year: Not on file    Homeless in the Last Year: No  Utilities: Not At Risk (03/23/2024)   Epic    Threatened with loss of utilities: No  Health Literacy: Adequate Health Literacy (03/23/2024)   B1300 Health Literacy    Frequency of need for help with medical instructions: Never                                                                                                  Objective:  Physical Exam: BP 118/74   Pulse (!) 52   Temp (!) 96.8 F (36 C)   Resp 16   Ht 5' 8 (1.727 m)   Wt 188 lb 4.8 oz (85.4 kg)   SpO2 97%   BMI 28.63 kg/m    Physical Exam VITALS: P- 52, BP- 129/77 GENERAL: Alert, cooperative, well developed, no  acute distress. HEENT: Normocephalic, normal oropharynx, moist mucous membranes. CHEST: Clear to auscultation bilaterally, no wheezes, rhonchi, or crackles. CARDIOVASCULAR: Regular rate  and rhythm, S1 and S2 normal without murmurs, rubs, or gallops. ABDOMEN: Soft, non-tender, non-distended, without organomegaly, normal bowel sounds. EXTREMITIES: No cyanosis or edema. NEUROLOGICAL: Cranial nerves grossly intact, moves all extremities without gross motor or sensory deficit.   Physical Exam  LONG TERM MONITOR (3-14 DAYS) Result Date: 06/28/2024 Cardiac monitor (Zio Patch): 05/15/2024 - 05/19/2024 Dominant rhythm sinus. Heart rate 47-182 bpm.  Avg HR 64 bpm. No atrial fibrillation detected during the monitoring period. No ventricular tachycardia, high grade AV block, pauses (3 seconds or longer). Total supraventricular ectopic burden <1%. Auto triggered episodes of PSVT Fastest episode 05/15/2024 at 1:28 PM, 3 beats, max HR 182 bpm, average HR 150 bpm. Total ventricular ectopic burden <1%. Patient triggered events: 0.     Recent Results (from the past 2160 hours)  Urinalysis, Routine w reflex microscopic     Status: Abnormal   Collection Time: 08/17/24 12:00 AM  Result Value Ref Range   Specific Gravity, UA 1.020 1.005 - 1.030   pH, UA 5.5 5.0 - 7.5   Color, UA Yellow Yellow   Appearance Ur Clear Clear   Leukocytes,UA 1+ (A) Negative   Protein,UA Trace (A) Negative/Trace   Glucose, UA Negative Negative   Ketones, UA Trace (A) Negative   RBC, UA 2+ (A) Negative   Bilirubin, UA Comment (A) Negative    Comment: Positive If liver dysfunction is suspected, a serum hepatic function panel is recommended.    Urobilinogen, Ur 1.0 0.2 - 1.0 mg/dL   Nitrite, UA Negative Negative   Microscopic Examination See below:   Microscopic Examination     Status: Abnormal   Collection Time: 08/17/24 12:00 AM   Urine  Result Value Ref Range   WBC, UA 6-10 (A) 0 - 5 /hpf   RBC, Urine 3-10 (A) 0 - 2  /hpf   Epithelial Cells (non renal) 0-10 0 - 10 /hpf   Mucus, UA Present Not Estab.   Bacteria, UA Moderate (A) None seen/Few        Beverley Adine Hummer, MD, MS "

## 2024-08-19 LAB — IRON,TIBC AND FERRITIN PANEL
%SAT: 49 % — ABNORMAL HIGH (ref 20–48)
Ferritin: 252 ng/mL (ref 24–380)
Iron: 90 ug/dL (ref 50–180)
TIBC: 185 ug/dL — ABNORMAL LOW (ref 250–425)

## 2024-08-19 LAB — MICROALBUMIN / CREATININE URINE RATIO
Creatinine,U: 63.4 mg/dL
Microalb Creat Ratio: UNDETERMINED mg/g (ref 0.0–30.0)
Microalb, Ur: <0.7 mg/dL

## 2024-08-20 ENCOUNTER — Ambulatory Visit: Payer: Self-pay | Admitting: Family Medicine

## 2024-08-20 LAB — URINALYSIS W MICROSCOPIC + REFLEX CULTURE
Bilirubin Urine: NEGATIVE
Glucose, UA: NEGATIVE
Hgb urine dipstick: NEGATIVE
Hyaline Cast: NONE SEEN /LPF
Ketones, ur: NEGATIVE
Leukocyte Esterase: NEGATIVE
Nitrites, Initial: NEGATIVE
Protein, ur: NEGATIVE
Specific Gravity, Urine: 1.011 (ref 1.001–1.035)
WBC, UA: NONE SEEN /HPF (ref 0–5)
pH: 7 (ref 5.0–8.0)

## 2024-08-20 LAB — NO CULTURE INDICATED

## 2024-08-25 ENCOUNTER — Ambulatory Visit
Admission: RE | Admit: 2024-08-25 | Discharge: 2024-08-25 | Disposition: A | Source: Ambulatory Visit | Attending: Nurse Practitioner

## 2024-08-25 ENCOUNTER — Other Ambulatory Visit: Payer: Self-pay | Admitting: Family Medicine

## 2024-08-25 DIAGNOSIS — E782 Mixed hyperlipidemia: Secondary | ICD-10-CM

## 2024-08-25 DIAGNOSIS — K7469 Other cirrhosis of liver: Secondary | ICD-10-CM

## 2024-08-25 DIAGNOSIS — K760 Fatty (change of) liver, not elsewhere classified: Secondary | ICD-10-CM

## 2024-09-01 ENCOUNTER — Telehealth: Payer: Self-pay

## 2024-09-01 DIAGNOSIS — F17210 Nicotine dependence, cigarettes, uncomplicated: Secondary | ICD-10-CM

## 2024-09-01 DIAGNOSIS — Z87891 Personal history of nicotine dependence: Secondary | ICD-10-CM

## 2024-09-01 DIAGNOSIS — Z122 Encounter for screening for malignant neoplasm of respiratory organs: Secondary | ICD-10-CM

## 2024-09-01 NOTE — Telephone Encounter (Signed)
 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 Advises he will check his schedule and let us  know during the SDMV when he can complete his scan.      74 y.o.   Phone: [phone]  Lung Screening Narrative (confirm age 24-77 yrs Medicare / 50-80 yrs Private pay insurance)   Insurance information:Medicare   Referring Provider: Medicare   This screening involves an initial phone call with a team member from our program. It is called a shared decision making visit. The initial meeting is required by  insurance and Medicare to make sure you understand the program. This appointment takes about 15-20 minutes to complete. You will complete the screening scan at your scheduled date/time.  This scan takes about 5-10 minutes to complete. You can eat and drink normally before and after the scan.  Criteria questions for Lung Cancer Screening:   Are you a current or former smoker? Current Age began smoking: 75   If you are a former smoker, what year did you quit smoking? Quit 23 years and started. (within 15 yrs)   To calculate your smoking history, I need an accurate estimate of how many packs of cigarettes you smoked per day and for how many years. (Not just the number of PPD you are now smoking)   Years smoking 31 x Packs per day 2.5 = Pack years 77.5   (at least 20 pack yrs)   (Make sure they understand that we need to know how much they have smoked in the past, not just the number of PPD they are smoking now)  Do you have a personal history of cancer?  No    Do you have a family history of cancer? Yes  (cancer type and and relative) Father had pancreatic cancer.   Are you coughing up blood?  No  Have you had unexplained weight loss of 15 lbs or more in the last 6 months?  No  It looks like you meet all criteria.  When would be a good time for us  to schedule you for this screening?   Additional information: N/A

## 2024-09-04 ENCOUNTER — Ambulatory Visit: Admitting: Adult Health

## 2024-09-04 ENCOUNTER — Encounter: Payer: Self-pay | Admitting: Adult Health

## 2024-09-04 DIAGNOSIS — F17210 Nicotine dependence, cigarettes, uncomplicated: Secondary | ICD-10-CM

## 2024-09-04 NOTE — Progress Notes (Signed)
" °  Virtual Visit via Telephone Note  I connected with Hector Matthews , 09/04/24 11:25 AM by a telemedicine application and verified that I am speaking with the correct person using two identifiers.  Location: Patient: home Provider: home   I discussed the limitations of evaluation and management by telemedicine and the availability of in person appointments. The patient expressed understanding and agreed to proceed.   Shared Decision Making Visit Lung Cancer Screening Program [phone])   Eligibility: 74 y.o. Pack Years Smoking History Calculation =77.5 pack years (# packs/per year x # years smoked) Recent History of coughing up blood  no Unexplained weight loss? no ( >Than 15 pounds within the last 6 months ) Prior History Lung / other cancer no (Diagnosis within the last 5 years already requiring surveillance chest CT Scans). Smoking Status Current Smoker  Visit Components: Discussion included one or more decision making aids. YES Discussion included risk/benefits of screening. YES Discussion included potential follow up diagnostic testing for abnormal scans. YES Discussion included meaning and risk of over diagnosis. YES Discussion included meaning and risk of False Positives. YES Discussion included meaning of total radiation exposure. YES  Counseling Included: Importance of adherence to annual lung cancer LDCT screening. YES Impact of comorbidities on ability to participate in the program. YES Ability and willingness to under diagnostic treatment. YES  Smoking Cessation Counseling: Current Smokers:  Discussed importance of smoking cessation. yes Information about tobacco cessation classes and interventions provided to patient. yes Patient provided with ticket for LDCT Scan. yes Symptomatic Patient. NO Diagnosis Code: Tobacco Use Z72.0 Asymptomatic Patient yes  Counseling - 4 minutes of smoking cessation counseling  (CT Chest Lung Cancer Screening Low Dose W/O CM)  [id number]  Smoking/Tobacco Cessation Counseling Hector Matthews is a current user of tobacco or nicotine products. He is considering quitting at this time. Counseling provided today addressed the risks of continued use and the benefits of cessation. Discussed tobacco/nicotine use history, readiness to quit, and evidence-based treatment options including behavioral strategies, support resources, and pharmacologic therapies. Provided encouragement and educational materials on steps and resources to quit smoking. Patient questions were addressed, and follow-up recommended for continued support. Total time spent on counseling: 3 minutes.    Z12.2-Screening of respiratory organs Z87.891-Personal history of nicotine dependence   Hector Matthews 09/04/24        "

## 2024-09-04 NOTE — Patient Instructions (Addendum)

## 2024-10-09 ENCOUNTER — Ambulatory Visit

## 2025-02-19 ENCOUNTER — Ambulatory Visit: Admitting: Urology

## 2025-02-22 ENCOUNTER — Ambulatory Visit: Admitting: Family Medicine

## 2025-03-29 ENCOUNTER — Ambulatory Visit
